# Patient Record
Sex: Female | Born: 1990
Health system: Southern US, Community
[De-identification: ages and names within clinical notes are randomized; demographics above are authoritative.]

## PROBLEM LIST (undated history)

## (undated) HISTORY — PX: TUBAL LIGATION: SHX77

---

## 2011-10-02 ENCOUNTER — Encounter (HOSPITAL_BASED_OUTPATIENT_CLINIC_OR_DEPARTMENT_OTHER): Payer: Self-pay | Admitting: *Deleted

## 2011-10-02 DIAGNOSIS — F17200 Nicotine dependence, unspecified, uncomplicated: Secondary | ICD-10-CM | POA: Insufficient documentation

## 2011-10-02 DIAGNOSIS — B9689 Other specified bacterial agents as the cause of diseases classified elsewhere: Secondary | ICD-10-CM | POA: Insufficient documentation

## 2011-10-02 DIAGNOSIS — N72 Inflammatory disease of cervix uteri: Secondary | ICD-10-CM | POA: Insufficient documentation

## 2011-10-02 DIAGNOSIS — N760 Acute vaginitis: Secondary | ICD-10-CM | POA: Insufficient documentation

## 2011-10-02 DIAGNOSIS — A499 Bacterial infection, unspecified: Secondary | ICD-10-CM | POA: Insufficient documentation

## 2011-10-02 DIAGNOSIS — M549 Dorsalgia, unspecified: Secondary | ICD-10-CM | POA: Insufficient documentation

## 2011-10-02 LAB — URINALYSIS, ROUTINE W REFLEX MICROSCOPIC
Bilirubin Urine: NEGATIVE
Glucose, UA: NEGATIVE mg/dL
Hgb urine dipstick: NEGATIVE
Ketones, ur: NEGATIVE mg/dL
Leukocytes, UA: NEGATIVE
Protein, ur: NEGATIVE mg/dL
pH: 5.5 (ref 5.0–8.0)

## 2011-10-02 NOTE — ED Notes (Signed)
Lower back pain. Late menses.

## 2011-10-03 ENCOUNTER — Encounter (HOSPITAL_BASED_OUTPATIENT_CLINIC_OR_DEPARTMENT_OTHER): Payer: Self-pay | Admitting: *Deleted

## 2011-10-03 ENCOUNTER — Emergency Department (HOSPITAL_BASED_OUTPATIENT_CLINIC_OR_DEPARTMENT_OTHER)
Admission: EM | Admit: 2011-10-03 | Discharge: 2011-10-03 | Disposition: A | Discharge status: Home / Self Care | Payer: Commercial Managed Care - PPO | Attending: Emergency Medicine | Admitting: Emergency Medicine

## 2011-10-03 DIAGNOSIS — N760 Acute vaginitis: Secondary | ICD-10-CM

## 2011-10-03 DIAGNOSIS — N72 Inflammatory disease of cervix uteri: Secondary | ICD-10-CM

## 2011-10-03 DIAGNOSIS — M549 Dorsalgia, unspecified: Secondary | ICD-10-CM

## 2011-10-03 DIAGNOSIS — B9689 Other specified bacterial agents as the cause of diseases classified elsewhere: Secondary | ICD-10-CM

## 2011-10-03 LAB — WET PREP, GENITAL
Trich, Wet Prep: NONE SEEN
Yeast Wet Prep HPF POC: NONE SEEN

## 2011-10-03 LAB — GC/CHLAMYDIA PROBE AMP, GENITAL: Chlamydia, DNA Probe: NEGATIVE

## 2011-10-03 MED ORDER — AZITHROMYCIN 250 MG PO TABS
1000.0000 mg | ORAL_TABLET | Freq: Once | ORAL | Status: AC
  Administered 2011-10-03: 1000 mg via ORAL
  Filled 2011-10-03: qty 4

## 2011-10-03 MED ORDER — CEFTRIAXONE SODIUM 250 MG IJ SOLR
250.0000 mg | Freq: Once | INTRAMUSCULAR | Status: AC
  Administered 2011-10-03: 250 mg via INTRAMUSCULAR
  Filled 2011-10-03: qty 250

## 2011-10-03 MED ORDER — LIDOCAINE HCL (PF) 1 % IJ SOLN
INTRAMUSCULAR | Status: AC
  Administered 2011-10-03: 1 mL
  Filled 2011-10-03: qty 5

## 2011-10-03 MED ORDER — IBUPROFEN 800 MG PO TABS
800.0000 mg | ORAL_TABLET | Freq: Once | ORAL | Status: AC
  Administered 2011-10-03: 800 mg via ORAL
  Filled 2011-10-03: qty 1

## 2011-10-03 MED ORDER — NAPROXEN 500 MG PO TABS: 500.0000 mg | ORAL_TABLET | Freq: Two times a day (BID) | ORAL | Status: AC

## 2011-10-03 MED ORDER — DOXYCYCLINE HYCLATE 100 MG PO CAPS: 100.0000 mg | ORAL_CAPSULE | Freq: Two times a day (BID) | ORAL | Status: AC

## 2011-10-03 MED ORDER — METRONIDAZOLE 500 MG PO TABS: 500.0000 mg | ORAL_TABLET | Freq: Two times a day (BID) | ORAL | Status: AC

## 2011-10-03 NOTE — Discharge Instructions (Signed)
Back Pain, Adult  Low back pain is very common. About 1 in 5 people have back pain. The cause of low back pain is rarely dangerous. The pain often gets better over time. About half of people with a sudden onset of back pain feel better in just 2 weeks. About 8 in 10 people feel better by 6 weeks.    CAUSES  Some common causes of back pain include:   Strain of the muscles or ligaments supporting the spine.    Wear and tear (degeneration) of the spinal discs.    Arthritis.    Direct injury to the back.   DIAGNOSIS  Most of the time, the direct cause of low back pain is not known. However, back pain can be treated effectively even when the exact cause of the pain is unknown. Answering your caregiver's questions about your overall health and symptoms is one of the most accurate ways to make sure the cause of your pain is not dangerous. If your caregiver needs more information, he or she may order lab work or imaging tests (X-rays or MRIs). However, even if imaging tests show changes in your back, this usually does not require surgery.  HOME CARE INSTRUCTIONS  For many people, back pain returns. Since low back pain is rarely dangerous, it is often a condition that people can learn to manage on their own.     Remain active. It is stressful on the back to sit or stand in one place. Do not sit, drive, or stand in one place for more than 30 minutes at a time. Take short walks on level surfaces as soon as pain allows. Try to increase the length of time you walk each day.    Do not stay in bed. Resting more than 1 or 2 days can delay your recovery.    Do not avoid exercise or work. Your body is made to move. It is not dangerous to be active, even though your back may hurt. Your back will likely heal faster if you return to being active before your pain is gone.     Pay attention to your body when you  bend and lift. Many people have less discomfort when lifting if they bend their knees, keep the load close to their bodies, and avoid twisting. Often, the most comfortable positions are those that put less stress on your recovering back.    Find a comfortable position to sleep. Use a firm mattress and lie on your side with your knees slightly bent. If you lie on your back, put a pillow under your knees.    Only take over-the-counter or prescription medicines as directed by your caregiver. Over-the-counter medicines to reduce pain and inflammation are often the most helpful. Your caregiver may prescribe muscle relaxant drugs. These medicines help dull your pain so you can more quickly return to your normal activities and healthy exercise.    Put ice on the injured area.    Put ice in a plastic bag.    Place a towel between your skin and the bag.    Leave the ice on for 15 to 20 minutes, 3 to 4 times a day for the first 2 to 3 days. After that, ice and heat may be alternated to reduce pain and spasms.    Ask your caregiver about trying back exercises and gentle massage. This may be of some benefit.    Avoid feeling anxious or stressed. Stress increases muscle tension and can worsen   back pain. It is important to recognize when you are anxious or stressed and learn ways to manage it. Exercise is a great option.   SEEK MEDICAL CARE IF:   You have pain that is not relieved with rest or medicine.    You have pain that does not improve in 1 week.    You have new symptoms.    You are generally not feeling well.   SEEK IMMEDIATE MEDICAL CARE IF:     You have pain that radiates from your back into your legs.    You develop new bowel or bladder control problems.    You have unusual weakness or numbness in your arms or legs.    You develop nausea or vomiting.    You develop abdominal pain.    You feel faint.    Document Released: 06/26/2005 Document Revised: 06/15/2011 Document Reviewed: 11/14/2010  [employer] Patient Information 2012 [employer]    Back Pain, Adult  Low back pain is very common. About 1 in 5 people have back pain. The cause of low back pain is rarely dangerous. The pain often gets better over time. About half of people with a sudden onset of back pain feel better in just 2 weeks. About 8 in 10 people feel better by 6 weeks.    CAUSES  Some common causes of back pain include:   Strain of the muscles or ligaments supporting the spine.    Wear and tear (degeneration) of the spinal discs.    Arthritis.    Direct injury to the back.   DIAGNOSIS  Most of the time, the direct cause of low back pain is not known. However, back pain can be treated effectively even when the exact cause of the pain is unknown. Answering your caregiver's questions about your overall health and symptoms is one of the most accurate ways to make sure the cause of your pain is not dangerous. If your caregiver needs more information, he or she may order lab work or imaging tests (X-rays or MRIs). However, even if imaging tests show changes in your back, this usually does not require surgery.  HOME CARE INSTRUCTIONS  For many people, back pain returns. Since low back pain is rarely dangerous, it is often a condition that people can learn to manage on their own.     Remain active. It is stressful on the back to sit or stand in one place. Do not sit, drive, or stand in one place for more than 30 minutes at a time. Take short walks on level surfaces as soon as pain allows. Try to increase the length of time you walk each day.    Do not stay in bed. Resting more than 1 or 2 days can delay your recovery.    Do not avoid exercise or work. Your body is made to move. It is not dangerous to be active, even though your back may hurt. Your back will likely heal faster if you return to being active before your pain is gone.     Pay attention to your body when you  bend and lift. Many people have less discomfort when lifting if they bend their knees, keep the load close to their bodies, and avoid twisting. Often, the most comfortable positions are those that put less stress on your recovering back.    Find a comfortable position to sleep. Use a firm mattress and lie on your side with your knees slightly bent. If you lie on   your back, put a pillow under your knees.    Only take over-the-counter or prescription medicines as directed by your caregiver. Over-the-counter medicines to reduce pain and inflammation are often the most helpful. Your caregiver may prescribe muscle relaxant drugs. These medicines help dull your pain so you can more quickly return to your normal activities and healthy exercise.    Put ice on the injured area.    Put ice in a plastic bag.    Place a towel between your skin and the bag.    Leave the ice on for 15 to 20 minutes, 3 to 4 times a day for the first 2 to 3 days. After that, ice and heat may be alternated to reduce pain and spasms.    Ask your caregiver about trying back exercises and gentle massage. This may be of some benefit.    Avoid feeling anxious or stressed. Stress increases muscle tension and can worsen back pain. It is important to recognize when you are anxious or stressed and learn ways to manage it. Exercise is a great option.   SEEK MEDICAL CARE IF:   You have pain that is not relieved with rest or medicine.    You have pain that does not improve in 1 week.    You have new symptoms.    You are generally not feeling well.   SEEK IMMEDIATE MEDICAL CARE IF:     You have pain that radiates from your back into your legs.    You develop new bowel or bladder control problems.    You have unusual weakness or numbness in your arms or legs.    You develop nausea or vomiting.    You develop abdominal pain.    You feel faint.    Document Released: 06/26/2005 Document Revised: 06/15/2011 Document Reviewed: 11/14/2010  [employer] Patient Information 2012 [employer].

## 2011-10-03 NOTE — ED Provider Notes (Signed)
History  This chart was scribed for Lindsay Roller, MD by Bennett Scrape. This patient was seen in room MH08/MH08 and the patient's care was started at 12:23AM.  CSN: [id number]  Arrival date & time 10/02/11  2129   First MD Initiated Contact with Patient 10/03/11 0022      Chief Complaint  Patient presents with  . Back Pain    The history is provided by the patient. No language interpreter was used.   Back pain started yesterday, was initially left lower back but today has spread to her right lower back as well. This is intermittent, sharp and stabbing, does not radiate and is not associated with fevers chills nausea or vomiting.  5 days late on period, home pregnancy test was negative, lower back pain, no fevers, nausea or vomiting, had ovarian pains about a week ago, vaginal discharge really bad one day last week, no h/o STDs, sexually active with a new partner, does not use protection every time, not on birth control.  History reviewed. No pertinent past medical history.  Past Surgical History  Procedure Date  . Cesarean section     No family history on file.  History  Substance Use Topics  . Smoking status: Current Everyday Smoker -- 0.5 packs/day  . Smokeless tobacco: Never Used  . Alcohol Use: 0.6 oz/week    1 Cans of beer per week    Review of Systems  Constitutional: Negative for fever and chills.  HENT: Negative for congestion, sore throat and ear discharge.   Respiratory: Negative for cough and shortness of breath.   Gastrointestinal: Negative for nausea, vomiting, abdominal pain and diarrhea.  Genitourinary: Positive for vaginal discharge. Negative for dysuria and hematuria.  Musculoskeletal: Positive for back pain.  Skin: Negative for rash.    Allergies  Review of patient's allergies indicates no known allergies.  Home Medications   Current Outpatient Rx  Name Route Sig Dispense Refill  . DOXYCYCLINE HYCLATE 100 MG PO CAPS Oral Take 1 capsule (100  mg total) by mouth 2 (two) times daily. 20 capsule 0  . METRONIDAZOLE 500 MG PO TABS Oral Take 1 tablet (500 mg total) by mouth 2 (two) times daily. 14 tablet 0  . NAPROXEN 500 MG PO TABS Oral Take 1 tablet (500 mg total) by mouth 2 (two) times daily with a meal. 30 tablet 0    Triage Vitals: BP 119/65  Pulse 84  Temp(Src) 98.1 F (36.7 C) (Oral)  Resp 18  SpO2 100%  LMP 08/30/2011  Physical Exam  Nursing note and vitals reviewed. Constitutional: She is oriented to person, place, and time. She appears well-developed and well-nourished.       Well appearing with normal affect and gait  HENT:  Head: Normocephalic and atraumatic.  Eyes: Conjunctivae and EOM are normal. Pupils are equal, round, and reactive to light.  Neck: Normal range of motion. Neck supple.  Cardiovascular: Normal rate, regular rhythm and normal heart sounds.   Pulmonary/Chest: Effort normal and breath sounds normal. No respiratory distress.  Abdominal: Soft. There is no tenderness.  Genitourinary:       Upon pelvic exam, pt had mild cervical motion tenderness and increased vaginal discharge  Musculoskeletal: Normal range of motion. She exhibits no edema.       No tenderness to palpation over the right or left lower back, patient is able to have full range of motion to rotation and flexion and extension without pain  Neurological: She is alert and oriented to  person, place, and time. No cranial nerve deficit.  Skin: Skin is warm and dry. No rash noted.    ED Course  Procedures (including critical care time)  DIAGNOSTIC STUDIES: Oxygen Saturation is 100% on room air, normal by my interpretation.    COORDINATION OF CARE: 12:25AM-Discussed STD tests and negative pregnancy with pt. Will give pt tylenol for back pain.   Labs Reviewed  WET PREP, GENITAL - Abnormal; Notable for the following:    Clue Cells Wet Prep HPF POC MANY (*)    WBC, Wet Prep HPF POC FEW (*)    All other components within normal limits    URINALYSIS, ROUTINE W REFLEX MICROSCOPIC  PREGNANCY, URINE  GC/CHLAMYDIA PROBE AMP, GENITAL   No results found.   1. Back pain   2. Bacterial vaginosis   3. Cervicitis       MDM  Laboratory results reviewed and show that the patient had many clue cells on the wet prep consistent with bacterial vaginosis, urinary test negative for pregnancy or urinary infection. Due to the cervical motion tenderness and vaginal discharge from the cervical os, patient was given ceftriaxone, Zithromax and Advil. She was also given prescriptions.  Back pain is benign, patient has no tenderness on exam  Patient informed of indications for return and that she would receive a phone call should she have a positive test for STDs. Education given on informing sexual partners should these tests because.  Disposition: Home  Discharge Prescriptions include:  #1 doxycycline #2 Flagyl #3 Naprosyn   I personally performed the services described in this documentation, which was scribed in my presence. The recorded information has been reviewed and considered.         Lindsay Roller, MD 10/03/11 [phone]

## 2012-11-12 ENCOUNTER — Encounter (HOSPITAL_BASED_OUTPATIENT_CLINIC_OR_DEPARTMENT_OTHER): Payer: Self-pay | Admitting: *Deleted

## 2012-11-12 ENCOUNTER — Emergency Department (HOSPITAL_BASED_OUTPATIENT_CLINIC_OR_DEPARTMENT_OTHER)
Admission: EM | Admit: 2012-11-12 | Discharge: 2012-11-12 | Disposition: A | Discharge status: Home / Self Care | Payer: Commercial Managed Care - PPO | Attending: Emergency Medicine | Admitting: Emergency Medicine

## 2012-11-12 DIAGNOSIS — F17200 Nicotine dependence, unspecified, uncomplicated: Secondary | ICD-10-CM | POA: Insufficient documentation

## 2012-11-12 DIAGNOSIS — Z79899 Other long term (current) drug therapy: Secondary | ICD-10-CM | POA: Insufficient documentation

## 2012-11-12 DIAGNOSIS — B86 Scabies: Secondary | ICD-10-CM | POA: Insufficient documentation

## 2012-11-12 MED ORDER — PERMETHRIN 5 % EX CREA: TOPICAL_CREAM | CUTANEOUS | Status: DC

## 2012-11-12 NOTE — ED Provider Notes (Signed)
History     CSN: [id number]  Arrival date & time 11/12/12  2038   First MD Initiated Contact with Patient 11/12/12 2121      Chief Complaint  Patient presents with  . Rash    (Consider location/radiation/quality/duration/timing/severity/associated sxs/prior treatment) Patient is a 22 y.o. female presenting with rash. The history is provided by the patient. No language interpreter was used.  Rash Location:  Full body Quality: itchiness and redness   Severity:  Moderate Duration:  5 days Timing:  Constant Progression:  Worsening Chronicity:  New Relieved by:  Nothing Worsened by:  Nothing tried Ineffective treatments:  None tried   History reviewed. No pertinent past medical history.  Past Surgical History  Procedure Laterality Date  . Cesarean section      No family history on file.  History  Substance Use Topics  . Smoking status: Current Every Day Smoker -- 0.50 packs/day  . Smokeless tobacco: Never Used  . Alcohol Use: 0.6 oz/week    1 Cans of beer per week    OB History   Grav Para Term Preterm Abortions TAB SAB Ect Mult Living                  Review of Systems  Skin: Positive for rash.  All other systems reviewed and are negative.    Allergies  Review of patient's allergies indicates no known allergies.  Home Medications   Current Outpatient Rx  Name  Route  Sig  Dispense  Refill  . permethrin (ELIMITE) 5 % cream      Apply to affected area once   60 g   1     BP 115/62  Pulse 63  Temp(Src) 99.2 F (37.3 C) (Oral)  Resp 16  Ht 5\' 4"  (1.626 m)  Wt 165 lb (74.844 kg)  BMI 28.31 kg/m2  SpO2 100%  Physical Exam  Nursing note and vitals reviewed. Constitutional: She is oriented to person, place, and time. She appears well-developed.  Musculoskeletal:  Rash between fingers  Neurological: She is alert and oriented to person, place, and time.  Skin: Rash noted.  Psychiatric: She has a normal mood and affect.    ED Course   Procedures (including critical care time)  Labs Reviewed - No data to display No results found.   1. Scabies       MDM  elemite       Elson Areas, PA-C 11/12/12 2142

## 2012-11-12 NOTE — ED Notes (Signed)
Rash x 5 days. Worse on her hands but is starting on her chest.

## 2012-11-13 NOTE — ED Provider Notes (Signed)
Medical screening examination/treatment/procedure(s) were performed by non-physician practitioner and as supervising physician I was immediately available for consultation/collaboration.  Novah Goza M Manvir Prabhu, MD 11/13/12 2121 

## 2014-06-08 ENCOUNTER — Emergency Department (HOSPITAL_BASED_OUTPATIENT_CLINIC_OR_DEPARTMENT_OTHER)
Admission: EM | Admit: 2014-06-08 | Discharge: 2014-06-08 | Disposition: A | Discharge status: Home / Self Care | Payer: Medicaid Other | Attending: Emergency Medicine | Admitting: Emergency Medicine

## 2014-06-08 ENCOUNTER — Encounter (HOSPITAL_BASED_OUTPATIENT_CLINIC_OR_DEPARTMENT_OTHER): Payer: Self-pay | Admitting: *Deleted

## 2014-06-08 DIAGNOSIS — F17210 Nicotine dependence, cigarettes, uncomplicated: Secondary | ICD-10-CM | POA: Insufficient documentation

## 2014-06-08 DIAGNOSIS — Z3A01 Less than 8 weeks gestation of pregnancy: Secondary | ICD-10-CM | POA: Insufficient documentation

## 2014-06-08 DIAGNOSIS — J069 Acute upper respiratory infection, unspecified: Secondary | ICD-10-CM | POA: Diagnosis not present

## 2014-06-08 DIAGNOSIS — R109 Unspecified abdominal pain: Secondary | ICD-10-CM | POA: Insufficient documentation

## 2014-06-08 DIAGNOSIS — Z79899 Other long term (current) drug therapy: Secondary | ICD-10-CM | POA: Diagnosis not present

## 2014-06-08 DIAGNOSIS — O99331 Smoking (tobacco) complicating pregnancy, first trimester: Secondary | ICD-10-CM | POA: Diagnosis not present

## 2014-06-08 DIAGNOSIS — O99511 Diseases of the respiratory system complicating pregnancy, first trimester: Secondary | ICD-10-CM | POA: Diagnosis not present

## 2014-06-08 DIAGNOSIS — O9989 Other specified diseases and conditions complicating pregnancy, childbirth and the puerperium: Secondary | ICD-10-CM | POA: Diagnosis present

## 2014-06-08 DIAGNOSIS — Z3490 Encounter for supervision of normal pregnancy, unspecified, unspecified trimester: Secondary | ICD-10-CM

## 2014-06-08 LAB — RAPID STREP SCREEN (MED CTR MEBANE ONLY): Streptococcus, Group A Screen (Direct): NEGATIVE

## 2014-06-08 MED ORDER — PRENATAL VITAMIN 27-0.8 MG PO TABS: 1.0000 | ORAL_TABLET | Freq: Every day | ORAL | Status: DC

## 2014-06-08 NOTE — ED Notes (Signed)
Reports "feeling weak" since yesterday- cough, runny nose, sore throat. + preg test 11/14

## 2014-06-08 NOTE — ED Notes (Signed)
Directed to pharmacy to pick up meds 

## 2014-06-08 NOTE — Discharge Instructions (Signed)
Return to the ED with any concerns including difficulty breathing, vomiting and not able to keep down liquids, fainting, vaginal bleeding, abdominal pain, decreased level of alertness/lethargy, or any other alarming symptoms

## 2014-06-08 NOTE — ED Provider Notes (Signed)
CSN: [id number]     Arrival date & time 06/08/14  [id number] History   First MD Initiated Contact with Patient 06/08/14 [phone]     Chief Complaint  Patient presents with  . Fatigue     (Consider location/radiation/quality/duration/timing/severity/associated sxs/prior Treatment) HPI  Pt presenting with c/o runny nose, sore throat and feeling fatigued with mild cough.  Symptoms started yesterday. She took positive pregnancy test on 11/14 at home. No vaginal bleeding.  No significant abdominal pain.  She states she has felt very mild cramping in lower abdomen intermittently for the past several weeks, but no significant pain.  No pain currently.  No fever/chills.  Cough is nonproductive. She has not had any treatment for her symptoms prior to arrival.  There are no other associated systemic symptoms, there are no other alleviating or modifying factors.   History reviewed. No pertinent past medical history. Past Surgical History  Procedure Laterality Date  . Cesarean section     No family history on file. History  Substance Use Topics  . Smoking status: Current Every Day Smoker -- 0.50 packs/day    Types: Cigarettes  . Smokeless tobacco: Never Used  . Alcohol Use: No     Comment: not currently   OB History    Gravida Para Term Preterm AB TAB SAB Ectopic Multiple Living   1              Review of Systems  ROS reviewed and all otherwise negative except for mentioned in HPI    Allergies  Review of patient's allergies indicates no known allergies.  Home Medications   Prior to Admission medications   Medication Sig Start Date End Date Taking? Authorizing Provider  permethrin (ELIMITE) 5 % cream Apply to affected area once 11/12/12   Elson AreasLeslie K Sofia, PA-C  Prenatal Vit-Fe Fumarate-FA (PRENATAL VITAMIN) 27-0.8 MG TABS Take 1 tablet by mouth daily. 06/08/14   Ethelda ChickMartha K Linker, MD   BP 121/67 mmHg  Pulse 78  Temp(Src) 98.7 F (37.1 C) (Oral)  Resp 16  Ht 5\' 4"  (1.626 m)  Wt 200 lb  (90.719 kg)  BMI 34.31 kg/m2  SpO2 100%  LMP 04/24/2014  Vitals reviewed Physical Exam  Physical Examination: General appearance - alert, well appearing, and in no distress Mental status - alert, oriented to person, place, and time Eyes - no conjunctival injection, no scleral icterus Mouth - mucous membranes moist, pharynx normal without lesions, mild erythema of posterior OP, palate symmetric, uvula midline Neck - supple, no significant adenopathy Chest - clear to auscultation, no wheezes, rales or rhonchi, symmetric air entry Heart - normal rate, regular rhythm, normal S1, S2, no murmurs, rubs, clicks or gallops Abdomen - soft, nontender, nondistended, no masses or organomegaly Extremities - peripheral pulses normal, no pedal edema, no clubbing or cyanosis Skin - normal coloration and turgor, no rashes  ED Course  Procedures (including critical care time) Labs Review Labs Reviewed  RAPID STREP SCREEN  CULTURE, GROUP A STREP    Imaging Review No results found.   EKG Interpretation None      MDM   Final diagnoses:  Viral URI  Pregnancy    Pt presenting with symptoms of viral URI, strep test negative, lungs CTA, normal respiratory effort, no tachypneia or hypoxia to suggest pneumonia- will avoid CXR in pregnancy.  Pt has no abdominal tenderness and no vaginal bleeding.  Given information for followup.  Recommended tylenol and nasal spray for symptoms and to avoid other meds during pregnancy  until consulting with OB.  Given rx for prenatal vitamins.  Discharged with strict return precautions.  Pt agreeable with plan.    Gricel Copen K LinEthelda Chickker, MD 06/08/14 [phone]

## 2014-06-10 LAB — CULTURE, GROUP A STREP

## 2017-05-16 ENCOUNTER — Encounter (HOSPITAL_BASED_OUTPATIENT_CLINIC_OR_DEPARTMENT_OTHER): Payer: Self-pay | Admitting: *Deleted

## 2017-05-16 ENCOUNTER — Emergency Department (HOSPITAL_BASED_OUTPATIENT_CLINIC_OR_DEPARTMENT_OTHER)
Admission: EM | Admit: 2017-05-16 | Discharge: 2017-05-16 | Disposition: A | Discharge status: Home / Self Care | Payer: Commercial Managed Care - PPO | Attending: Emergency Medicine | Admitting: Emergency Medicine

## 2017-05-16 ENCOUNTER — Other Ambulatory Visit: Payer: Self-pay

## 2017-05-16 ENCOUNTER — Emergency Department (HOSPITAL_BASED_OUTPATIENT_CLINIC_OR_DEPARTMENT_OTHER): Payer: Commercial Managed Care - PPO

## 2017-05-16 DIAGNOSIS — K429 Umbilical hernia without obstruction or gangrene: Secondary | ICD-10-CM | POA: Insufficient documentation

## 2017-05-16 DIAGNOSIS — F17210 Nicotine dependence, cigarettes, uncomplicated: Secondary | ICD-10-CM | POA: Insufficient documentation

## 2017-05-16 DIAGNOSIS — R0789 Other chest pain: Secondary | ICD-10-CM | POA: Insufficient documentation

## 2017-05-16 LAB — URINALYSIS, MICROSCOPIC (REFLEX)

## 2017-05-16 LAB — COMPREHENSIVE METABOLIC PANEL
ALT: 18 U/L (ref 14–54)
AST: 16 U/L (ref 15–41)
Albumin: 4 g/dL (ref 3.5–5.0)
Alkaline Phosphatase: 81 U/L (ref 38–126)
Anion gap: 8 (ref 5–15)
BILIRUBIN TOTAL: 0.4 mg/dL (ref 0.3–1.2)
BUN: 12 mg/dL (ref 6–20)
CO2: 23 mmol/L (ref 22–32)
CREATININE: 0.69 mg/dL (ref 0.44–1.00)
Calcium: 9.1 mg/dL (ref 8.9–10.3)
Chloride: 105 mmol/L (ref 101–111)
GFR calc Af Amer: >60 mL/min (ref >60)
Glucose, Bld: 90 mg/dL (ref 65–99)
Potassium: 4 mmol/L (ref 3.5–5.1)
Sodium: 136 mmol/L (ref 135–145)
TOTAL PROTEIN: 8 g/dL (ref 6.5–8.1)

## 2017-05-16 LAB — CBC
HEMATOCRIT: 42.6 % (ref 36.0–46.0)
HEMOGLOBIN: 14.1 g/dL (ref 12.0–15.0)
MCH: 31.3 pg (ref 26.0–34.0)
MCHC: 33.1 g/dL (ref 30.0–36.0)
MCV: 94.5 fL (ref 78.0–100.0)
Platelets: 381 10*3/uL (ref 150–400)
RBC: 4.51 MIL/uL (ref 3.87–5.11)
RDW: 12.8 % (ref 11.5–15.5)
WBC: 17.2 10*3/uL — AB (ref 4.0–10.5)

## 2017-05-16 LAB — URINALYSIS, ROUTINE W REFLEX MICROSCOPIC
GLUCOSE, UA: NEGATIVE mg/dL
KETONES UR: 15 mg/dL — AB
Nitrite: NEGATIVE
Protein, ur: NEGATIVE mg/dL
Specific Gravity, Urine: >1.03 — ABNORMAL HIGH (ref 1.005–1.030)
pH: 5.5 (ref 5.0–8.0)

## 2017-05-16 LAB — PREGNANCY, URINE: Preg Test, Ur: NEGATIVE

## 2017-05-16 LAB — TROPONIN I

## 2017-05-16 NOTE — ED Notes (Signed)
ED Provider at bedside. 

## 2017-05-16 NOTE — ED Provider Notes (Signed)
[facility] EMERGENCY DEPARTMENT Provider Note   CSN: [id number] Arrival date & time: 05/16/17  1048     History   Chief Complaint Chief Complaint  Patient presents with  . Chest Pain    HPI Lindsay Bernard is a 26 y.o. female.  26 yo F with a chief complaint of chest pain.  She describes this as a pressure as all of her chest.  Has been going on for the past month and a half or so.  She thinks it might be slightly worse with exertion.  She does have some shortness of breath on exertion.  Denies lower extremity edema denies hemoptysis denies prior PE or DVT denies estrogen use.  Denies recent surgery or hospitalization.  Denies prolonged travel.  The patient is a smoker.  She denies hypertension, hyperlipidemia, diabetes, or family history of MI.  This pain starts in the left side of her chest and then transiently moved to the left lateral aspect of the chest wall and then up to the trapezius area.  Her symptoms have mildly improved since they started earlier today.   The history is provided by the patient.  Chest Pain   This is a new problem. The current episode started more than 1 week ago. The problem occurs constantly. The problem has been gradually worsening. The pain is associated with exertion and movement. The pain is present in the lateral region. The pain is at a severity of 4/10. The pain is moderate. The quality of the pain is described as brief, exertional, heavy and sharp. The pain radiates to the left neck. Duration of episode(s) is 1 month. Associated symptoms include shortness of breath. Pertinent negatives include no dizziness, no fever, no headaches, no nausea, no palpitations and no vomiting. She has tried nothing for the symptoms. The treatment provided no relief. Risk factors include smoking/tobacco exposure.  Pertinent negatives for past medical history include no diabetes, no hyperlipidemia, no hypertension and no MI.  Pertinent negatives for family medical  history include: no early MI.    History reviewed. No pertinent past medical history.  There are no active problems to display for this patient.   Past Surgical History:  Procedure Laterality Date  . CESAREAN SECTION    . CESAREAN SECTION    . TUBAL LIGATION      OB History    Gravida Para Term Preterm AB Living   1             SAB TAB Ectopic Multiple Live Births                   Home Medications    Prior to Admission medications   Medication Sig Start Date End Date Taking? Authorizing Provider  permethrin (ELIMITE) 5 % cream Apply to affected area once 11/12/12   Elson AreasSofia, Leslie K, PA-C  Prenatal Vit-Fe Fumarate-FA (PRENATAL VITAMIN) 27-0.8 MG TABS Take 1 tablet by mouth daily. 06/08/14   MabeLatanya Maudlin, Martha L, MD    Family History No family history on file.  Social History Social History   Tobacco Use  . Smoking status: Current Every Day Smoker    Packs/day: 0.50    Types: Cigarettes  . Smokeless tobacco: Never Used  Substance Use Topics  . Alcohol use: No    Comment: not currently  . Drug use: No     Allergies   Patient has no known allergies.   Review of Systems Review of Systems  Constitutional: Negative for chills and fever.  HENT: Negative for congestion and rhinorrhea.   Eyes: Negative for redness and visual disturbance.  Respiratory: Positive for shortness of breath. Negative for wheezing.   Cardiovascular: Positive for chest pain. Negative for palpitations.  Gastrointestinal: Negative for nausea and vomiting.  Genitourinary: Negative for dysuria and urgency.  Musculoskeletal: Negative for arthralgias and myalgias.  Skin: Negative for pallor and wound.  Neurological: Negative for dizziness and headaches.     Physical Exam Updated Vital Signs BP 126/85 (BP Location: Right Wrist)   Pulse 69   Temp 98.4 F (36.9 C) (Oral)   Resp 16   Ht 5\' 4"  (1.626 m)   Wt 112.9 kg (249 lb)   LMP 04/13/2017   SpO2 100%   Breastfeeding? No   BMI 42.74  kg/m   Physical Exam  Constitutional: She is oriented to person, place, and time. She appears well-developed and well-nourished. No distress.  HENT:  Head: Normocephalic and atraumatic.  Eyes: EOM are normal. Pupils are equal, round, and reactive to light.  Neck: Normal range of motion. Neck supple.  Cardiovascular: Normal rate and regular rhythm. Exam reveals no gallop and no friction rub.  No murmur heard. Left-sided chest wall tenderness that reproduces her pain.  Pulmonary/Chest: Effort normal. She has no wheezes. She has no rales.  Abdominal: Soft. She exhibits no distension. There is no tenderness.  Musculoskeletal: She exhibits no edema or tenderness.  Neurological: She is alert and oriented to person, place, and time.  Skin: Skin is warm and dry. She is not diaphoretic.  Psychiatric: She has a normal mood and affect. Her behavior is normal.  Nursing note and vitals reviewed.    ED Treatments / Results  Labs (all labs ordered are listed, but only abnormal results are displayed) Labs Reviewed  CBC - Abnormal; Notable for the following components:      Result Value   WBC 17.2 (*)    All other components within normal limits  URINALYSIS, ROUTINE W REFLEX MICROSCOPIC - Abnormal; Notable for the following components:   APPearance CLOUDY (*)    Specific Gravity, Urine >1.030 (*)    Hgb urine dipstick MODERATE (*)    Bilirubin Urine SMALL (*)    Ketones, ur 15 (*)    Leukocytes, UA SMALL (*)    All other components within normal limits  URINALYSIS, MICROSCOPIC (REFLEX) - Abnormal; Notable for the following components:   Bacteria, UA MANY (*)    Squamous Epithelial / LPF 6-30 (*)    All other components within normal limits  COMPREHENSIVE METABOLIC PANEL  TROPONIN I  PREGNANCY, URINE    EKG  EKG Interpretation  Date/Time:  Wednesday May 16 2017 11:07:10 EST Ventricular Rate:  72 PR Interval:  146 QRS Duration: 82 QT Interval:  384 QTC Calculation: 420 R  Axis:   52 Text Interpretation:  Normal sinus rhythm Cannot rule out Anterior infarct , age undetermined Abnormal ECG No old tracing to compare Confirmed by Melene PlanFloyd, Ad Guttman [phone]([id number]) on 05/16/2017 3:05:35 PM       Radiology Dg Chest 2 View  Result Date: 05/16/2017 CLINICAL DATA:  Intermittent chest pains and chest tightness with shortness of breath for several months but increased severity yesterday. Current smoker. EXAM: CHEST  2 VIEW COMPARISON:  None in PACs FINDINGS: The lungs are adequately inflated and clear. The heart and pulmonary vascularity are normal. The mediastinum is normal in width. There is no pleural effusion. The bony thorax exhibits no acute abnormality. IMPRESSION: There is no active cardiopulmonary disease.  Electronically Signed   By: David  [place] M.D.   On: 05/16/2017 13:09    Procedures Procedures (including critical care time)  Medications Ordered in ED Medications - No data to display   Initial Impression / Assessment and Plan / ED Course  I have reviewed the triage vital signs and the nursing notes.  Pertinent labs & imaging results that were available during my care of the patient were reviewed by me and considered in my medical decision making (see chart for details).     26 yo F with a chief complaint of chest pain.  This is atypical in nature.  It is reproduced with palpation of the chest wall.  She is PERC negative.  She had a workup done in triage EKG chest x-ray troponin all negative.  The patient is also had a umbilical hernia that has bothered her for the past couple years.  They are wondering what to do with it.  We will give her surgical follow-up.  Discharge home.  Discussed smoking cessation with patient and was they were offerred resources to help stop.  Total time was 5 min CPT code 40981.    3:33 PM:  I have discussed the diagnosis/risks/treatment options with the patient and family and believe the pt to be eligible for discharge home to follow-up  with PCP, Gen surgery. We also discussed returning to the ED immediately if new or worsening sx occur. We discussed the sx which are most concerning (e.g., sudden worsening pain, fever, inability to tolerate by mouth) that necessitate immediate return. Medications administered to the patient during their visit and any new prescriptions provided to the patient are listed below.  Medications given during this visit Medications - No data to display   The patient appears reasonably screen and/or stabilized for discharge and I doubt any other medical condition or other [facility] requiring further screening, evaluation, or treatment in the ED at this time prior to discharge.    Final Clinical Impressions(s) / ED Diagnoses   Final diagnoses:  Atypical chest pain  Umbilical hernia without obstruction and without gangrene    ED Discharge Orders    None       Melene Plan, DO 05/16/17 1533

## 2017-05-16 NOTE — ED Triage Notes (Signed)
Pt reports chest pain since last night across chest. States she "coughed real hard" then couldn't catch her breath. "I felt like I was having a panic attack". States chest "feels tight". Also reports pain in her left axilla

## 2017-05-16 NOTE — ED Notes (Signed)
Patient transported to X-ray 

## 2017-05-16 NOTE — Discharge Instructions (Signed)
Take 4 over the counter ibuprofen tablets 3 times a day or 2 over-the-counter naproxen tablets twice a day for pain. Also take tylenol 1000mg(2 extra strength) four times a day.    

## 2017-08-13 ENCOUNTER — Encounter (HOSPITAL_BASED_OUTPATIENT_CLINIC_OR_DEPARTMENT_OTHER): Payer: Self-pay

## 2017-08-13 ENCOUNTER — Other Ambulatory Visit: Payer: Self-pay

## 2017-08-13 DIAGNOSIS — K649 Unspecified hemorrhoids: Secondary | ICD-10-CM | POA: Insufficient documentation

## 2017-08-13 DIAGNOSIS — Z5321 Procedure and treatment not carried out due to patient leaving prior to being seen by health care provider: Secondary | ICD-10-CM | POA: Insufficient documentation

## 2017-08-13 NOTE — ED Triage Notes (Signed)
C/o hemorrhoid pain x today-NAD-slow gait

## 2017-08-14 ENCOUNTER — Other Ambulatory Visit: Payer: Self-pay

## 2017-08-14 ENCOUNTER — Encounter (HOSPITAL_BASED_OUTPATIENT_CLINIC_OR_DEPARTMENT_OTHER): Payer: Self-pay | Admitting: *Deleted

## 2017-08-14 ENCOUNTER — Emergency Department (HOSPITAL_BASED_OUTPATIENT_CLINIC_OR_DEPARTMENT_OTHER)
Admission: EM | Admit: 2017-08-14 | Discharge: 2017-08-14 | Discharge status: Against Medical Advice | Payer: Commercial Managed Care - PPO | Attending: Emergency Medicine | Admitting: Emergency Medicine

## 2017-08-14 ENCOUNTER — Emergency Department (HOSPITAL_BASED_OUTPATIENT_CLINIC_OR_DEPARTMENT_OTHER)
Admission: EM | Admit: 2017-08-14 | Discharge: 2017-08-14 | Disposition: A | Discharge status: Home / Self Care | Payer: Commercial Managed Care - PPO | Attending: Emergency Medicine | Admitting: Emergency Medicine

## 2017-08-14 DIAGNOSIS — K645 Perianal venous thrombosis: Secondary | ICD-10-CM | POA: Insufficient documentation

## 2017-08-14 DIAGNOSIS — F17210 Nicotine dependence, cigarettes, uncomplicated: Secondary | ICD-10-CM | POA: Insufficient documentation

## 2017-08-14 MED ORDER — LIDOCAINE-EPINEPHRINE 2 %-1:100000 IJ SOLN
20.0000 mL | Freq: Once | INTRAMUSCULAR | Status: DC
  Filled 2017-08-14: qty 1

## 2017-08-14 MED ORDER — LIDOCAINE HCL 2 % IJ SOLN
INTRAMUSCULAR | Status: AC
  Filled 2017-08-14: qty 20

## 2017-08-14 MED ORDER — LIDOCAINE HCL (PF) 2 % IJ SOLN
INTRAMUSCULAR | Status: AC
  Filled 2017-08-14: qty 2

## 2017-08-14 NOTE — ED Notes (Signed)
ED Provider at bedside. 

## 2017-08-14 NOTE — Discharge Instructions (Signed)
Call the General Surgery team for follow up if not improving

## 2017-08-14 NOTE — ED Triage Notes (Signed)
Pt has hemorrhoids. States increased pain and swelling after BM yesterday

## 2017-08-14 NOTE — ED Notes (Signed)
Called to treatment room with no answer from lobby 

## 2017-08-16 NOTE — ED Provider Notes (Signed)
[facility] EMERGENCY DEPARTMENT Provider Note   CSN: [id number] Arrival date & time: 08/14/17  1004     History   Chief Complaint Chief Complaint  Patient presents with  . Rectal Pain    HPI Lindsay Bernard is a 27 y.o. female.  HPI Patient is a 27 year old female presents to the emergency department with increasing hemorrhoidal pain.  No fevers or chills.  She has had hemorrhoids before but feels as though she has a large swollen hemorrhoid that is causing her significant discomfort.  It hurts to sit down.  Pain is moderate in severity.  No other complaints.   History reviewed. No pertinent past medical history.  There are no active problems to display for this patient.   Past Surgical History:  Procedure Laterality Date  . CESAREAN SECTION    . CESAREAN SECTION    . TUBAL LIGATION      OB History    Gravida Para Term Preterm AB Living   1             SAB TAB Ectopic Multiple Live Births                   Home Medications    Prior to Admission medications   Not on File    Family History No family history on file.  Social History Social History   Tobacco Use  . Smoking status: Current Every Day Smoker    Packs/day: 0.50    Types: Cigarettes  . Smokeless tobacco: Never Used  Substance Use Topics  . Alcohol use: No  . Drug use: No     Allergies   Patient has no known allergies.   Review of Systems Review of Systems  All other systems reviewed and are negative.    Physical Exam Updated Vital Signs BP 111/73 (BP Location: Left Arm)   Pulse 78   Temp 98 F (36.7 C) (Oral)   Resp 18   Ht 5\' 4"  (1.626 m)   Wt 114.8 kg (253 lb)   LMP 07/21/2017   SpO2 100%   BMI 43.43 kg/m   Physical Exam  Constitutional: She is oriented to person, place, and time. She appears well-developed and well-nourished.  HENT:  Head: Normocephalic.  Eyes: EOM are normal.  Neck: Normal range of motion.  Pulmonary/Chest: Effort normal.  Abdominal:  She exhibits no distension.  Genitourinary:  Genitourinary Comments: Chaperone present.  Large thrombosed external hemorrhoid.  No surrounding perianal erythema or signs of infection.  Musculoskeletal: Normal range of motion.  Neurological: She is alert and oriented to person, place, and time.  Psychiatric: She has a normal mood and affect.  Nursing note and vitals reviewed.    ED Treatments / Results  Labs (all labs ordered are listed, but only abnormal results are displayed) Labs Reviewed - No data to display  EKG  EKG Interpretation None       Radiology No results found.  Procedures .Marland KitchenIncision and Drainage Performed by: Azalia Bilis, MD Authorized by: Azalia Bilis, MD     EXCISION OF THROMBOSED EXTERNAL HEMMORRHOID Anesthesia: 5 cc 2% lidocaine with epi Procedure: After appropriate and standard sterilization of the surgical field and anesthesia applied a linear incision was made along the largest section of the external hemorrhoid.  Large clot was delivered with significant improvement in the patient's pain.  No significant bleeding post procedure.  Patient tolerated the procedure well.  No complications.    Medications Ordered in ED Medications - No  data to display   Initial Impression / Assessment and Plan / ED Course  I have reviewed the triage vital signs and the nursing notes.  Pertinent labs & imaging results that were available during my care of the patient were reviewed by me and considered in my medical decision making (see chart for details).     Patient tolerated the procedure well.  Significant improvement in her pain after excision of the thrombosed external hemorrhoid and delivery of the clot.  Discharged home with recommendation for sits baths and general surgery follow-up for possible formal hemorrhoidectomy.  Patient overall well-appearing.  Stable for discharge.  No bleeding.  Patient given infection warnings.  She understands to return to the  emergency department for new or worsening symptoms  Final Clinical Impressions(s) / ED Diagnoses   Final diagnoses:  Thrombosed external hemorrhoid    ED Discharge Orders    None       Azalia Bilisampos, Lelania Bia, MD 08/16/17 [phone]

## 2018-04-04 ENCOUNTER — Encounter (HOSPITAL_BASED_OUTPATIENT_CLINIC_OR_DEPARTMENT_OTHER): Payer: Self-pay | Admitting: Emergency Medicine

## 2018-04-04 ENCOUNTER — Other Ambulatory Visit: Payer: Self-pay

## 2018-04-04 ENCOUNTER — Emergency Department (HOSPITAL_BASED_OUTPATIENT_CLINIC_OR_DEPARTMENT_OTHER): Payer: Commercial Managed Care - PPO

## 2018-04-04 ENCOUNTER — Emergency Department (HOSPITAL_BASED_OUTPATIENT_CLINIC_OR_DEPARTMENT_OTHER)
Admission: EM | Admit: 2018-04-04 | Discharge: 2018-04-04 | Disposition: A | Discharge status: Home / Self Care | Payer: Commercial Managed Care - PPO | Attending: Emergency Medicine | Admitting: Emergency Medicine

## 2018-04-04 DIAGNOSIS — J069 Acute upper respiratory infection, unspecified: Secondary | ICD-10-CM

## 2018-04-04 DIAGNOSIS — B9789 Other viral agents as the cause of diseases classified elsewhere: Secondary | ICD-10-CM

## 2018-04-04 DIAGNOSIS — F17210 Nicotine dependence, cigarettes, uncomplicated: Secondary | ICD-10-CM | POA: Insufficient documentation

## 2018-04-04 MED ORDER — AMOXICILLIN 500 MG PO CAPS: 500.0000 mg | ORAL_CAPSULE | Freq: Three times a day (TID) | ORAL | 0 refills | Status: DC

## 2018-04-04 MED ORDER — BENZONATATE 100 MG PO CAPS: 100.0000 mg | ORAL_CAPSULE | Freq: Three times a day (TID) | ORAL | 0 refills | Status: DC

## 2018-04-04 NOTE — ED Notes (Signed)
Patient transported to X-ray 

## 2018-04-04 NOTE — ED Triage Notes (Signed)
Cough and congestion x 2 days 

## 2018-04-04 NOTE — Discharge Instructions (Signed)
Return if any problems.

## 2018-04-05 NOTE — ED Provider Notes (Signed)
[facility] EMERGENCY DEPARTMENT Provider Note   CSN: [id number] Arrival date & time: 04/04/18  1736     History   Chief Complaint Chief Complaint  Patient presents with  . Cough    HPI Lindsay Bernard is a 27 y.o. female.  The history is provided by the patient. No language interpreter was used.  Cough  This is a new problem. The current episode started 2 days ago. The problem occurs constantly. The problem has been gradually worsening. The cough is productive of sputum. There has been no fever. She has tried nothing for the symptoms. The treatment provided no relief. She is a smoker. Her past medical history does not include bronchitis.  Pt reports she has been coughing.  Pt reports sinus congestion    History reviewed. No pertinent past medical history.  There are no active problems to display for this patient.   Past Surgical History:  Procedure Laterality Date  . CESAREAN SECTION    . CESAREAN SECTION    . TUBAL LIGATION       OB History    Gravida  1   Para      Term      Preterm      AB      Living        SAB      TAB      Ectopic      Multiple      Live Births               Home Medications    Prior to Admission medications   Medication Sig Start Date End Date Taking? Authorizing Provider  amoxicillin (AMOXIL) 500 MG capsule Take 1 capsule (500 mg total) by mouth 3 (three) times daily. 04/04/18   Elson Areas, PA-C  benzonatate (TESSALON) 100 MG capsule Take 1 capsule (100 mg total) by mouth every 8 (eight) hours. 04/04/18   Elson Areas, PA-C    Family History No family history on file.  Social History Social History   Tobacco Use  . Smoking status: Current Every Day Smoker    Packs/day: 0.50    Types: Cigarettes  . Smokeless tobacco: Never Used  Substance Use Topics  . Alcohol use: No  . Drug use: No     Allergies   Patient has no known allergies.   Review of Systems Review of Systems  Respiratory:  Positive for cough.   All other systems reviewed and are negative.    Physical Exam Updated Vital Signs BP 127/82 (BP Location: Left Arm)   Pulse 87   Temp 98.4 F (36.9 C) (Oral)   Resp 20   Ht 5\' 4"  (1.626 m)   Wt 113.4 kg   LMP 03/31/2018   SpO2 100%   BMI 42.91 kg/m   Physical Exam  Constitutional: She appears well-developed and well-nourished.  HENT:  Head: Normocephalic.  Eyes: Pupils are equal, round, and reactive to light. Conjunctivae are normal.  Neck: Normal range of motion.  Cardiovascular: Normal rate.  Pulmonary/Chest: Effort normal.  Abdominal: Soft.  Musculoskeletal: Normal range of motion.  Neurological: She is alert.  Skin: Skin is warm.  Psychiatric: She has a normal mood and affect.  Nursing note and vitals reviewed.    ED Treatments / Results  Labs (all labs ordered are listed, but only abnormal results are displayed) Labs Reviewed - No data to display  EKG None  Radiology Dg Chest 2 View  Result Date: 04/04/2018  CLINICAL DATA:  Cough and congestion 2 days. EXAM: CHEST - 2 VIEW COMPARISON:  05/16/2017 FINDINGS: Lungs are adequately inflated without focal consolidation or effusion. Cardiomediastinal silhouette and remainder of the exam is unchanged. IMPRESSION: No active cardiopulmonary disease. Electronically Signed   By: Elberta Fortis M.D.   On: 04/04/2018 18:54    Procedures Procedures (including critical care time)  Medications Ordered in ED Medications - No data to display   Initial Impression / Assessment and Plan / ED Course  I have reviewed the triage vital signs and the nursing notes.  Pertinent labs & imaging results that were available during my care of the patient were reviewed by me and considered in my medical decision making (see chart for details).     MDM  Chest xray reviewed and discussed with pt.  Pt given rx for amoxicillian and tessalon perles  Final Clinical Impressions(s) / ED Diagnoses   Final diagnoses:    Viral URI with cough    ED Discharge Orders         Ordered    benzonatate (TESSALON) 100 MG capsule  Every 8 hours     04/04/18 1949    amoxicillin (AMOXIL) 500 MG capsule  3 times daily     04/04/18 1949           Osie Cheeks 04/05/18 0012    Melene Plan, DO 04/07/18 [phone]

## 2018-08-24 ENCOUNTER — Other Ambulatory Visit: Payer: Self-pay

## 2018-08-24 ENCOUNTER — Emergency Department (HOSPITAL_BASED_OUTPATIENT_CLINIC_OR_DEPARTMENT_OTHER)
Admission: EM | Admit: 2018-08-24 | Discharge: 2018-08-24 | Disposition: A | Discharge status: Home / Self Care | Payer: Self-pay | Attending: Emergency Medicine | Admitting: Emergency Medicine

## 2018-08-24 ENCOUNTER — Encounter (HOSPITAL_BASED_OUTPATIENT_CLINIC_OR_DEPARTMENT_OTHER): Payer: Self-pay | Admitting: *Deleted

## 2018-08-24 DIAGNOSIS — Y929 Unspecified place or not applicable: Secondary | ICD-10-CM | POA: Insufficient documentation

## 2018-08-24 DIAGNOSIS — S60561A Insect bite (nonvenomous) of right hand, initial encounter: Secondary | ICD-10-CM | POA: Insufficient documentation

## 2018-08-24 DIAGNOSIS — F17210 Nicotine dependence, cigarettes, uncomplicated: Secondary | ICD-10-CM | POA: Insufficient documentation

## 2018-08-24 DIAGNOSIS — Y998 Other external cause status: Secondary | ICD-10-CM | POA: Insufficient documentation

## 2018-08-24 DIAGNOSIS — W57XXXA Bitten or stung by nonvenomous insect and other nonvenomous arthropods, initial encounter: Secondary | ICD-10-CM | POA: Insufficient documentation

## 2018-08-24 DIAGNOSIS — Y939 Activity, unspecified: Secondary | ICD-10-CM | POA: Insufficient documentation

## 2018-08-24 MED ORDER — CEPHALEXIN 500 MG PO CAPS: 500.0000 mg | ORAL_CAPSULE | Freq: Four times a day (QID) | ORAL | 0 refills | Status: DC

## 2018-08-24 MED ORDER — DIPHENHYDRAMINE HCL 25 MG PO TABS: 25.0000 mg | ORAL_TABLET | Freq: Four times a day (QID) | ORAL | 0 refills | Status: AC | PRN

## 2018-08-24 NOTE — ED Triage Notes (Signed)
Pt reports insect bite to right hand Thursday and her hand is red and swollen

## 2018-08-24 NOTE — ED Provider Notes (Signed)
[facility] EMERGENCY DEPARTMENT Provider Note   CSN: [id number] Arrival date & time: 08/24/18  1845     History   Chief Complaint Chief Complaint  Patient presents with  . Insect Bite    HPI Lindsay Bernard is a 28 y.o. female who is previously healthy who presents with a 3-day history of right hand swelling and redness after insect bite.  Patient did not see a bug bite her, but felt something when she was has been itching in the area.  She reports she has had spread of redness.  She denies any severe pain, but her hand does feel swollen.  She denies any difficulty breathing or swelling of her lips, tongue, throat.  She denies any fevers.  She has used Benadryl cream with some relief.  HPI  History reviewed. No pertinent past medical history.  There are no active problems to display for this patient.   Past Surgical History:  Procedure Laterality Date  . CESAREAN SECTION    . CESAREAN SECTION    . TUBAL LIGATION       OB History    Gravida  1   Para      Term      Preterm      AB      Living        SAB      TAB      Ectopic      Multiple      Live Births               Home Medications    Prior to Admission medications   Medication Sig Start Date End Date Taking? Authorizing Provider  amoxicillin (AMOXIL) 500 MG capsule Take 1 capsule (500 mg total) by mouth 3 (three) times daily. 04/04/18   Elson Areas, PA-C  benzonatate (TESSALON) 100 MG capsule Take 1 capsule (100 mg total) by mouth every 8 (eight) hours. 04/04/18   Elson Areas, PA-C  cephALEXin (KEFLEX) 500 MG capsule Take 1 capsule (500 mg total) by mouth 4 (four) times daily. 08/24/18   As[place] Craigo, Waylan Boga, PA-C  diphenhydrAMINE (BENADRYL) 25 MG tablet Take 1 tablet (25 mg total) by mouth every 6 (six) hours as needed for itching. 08/24/18   Emi Holes, PA-C    Family History No family history on file.  Social History Social History   Tobacco Use  . Smoking status:  Current Every Day Smoker    Packs/day: 0.50    Types: Cigarettes  . Smokeless tobacco: Never Used  Substance Use Topics  . Alcohol use: Yes    Comment: occasional  . Drug use: No     Allergies   Patient has no known allergies.   Review of Systems Review of Systems  Constitutional: Negative for fever.  HENT: Negative for facial swelling.   Respiratory: Negative for shortness of breath.   Skin: Positive for color change.     Physical Exam Updated Vital Signs BP 111/68 (BP Location: Left Arm)   Pulse 70   Temp 97.9 F (36.6 C) (Oral)   Resp 18   Ht 5\' 4"  (1.626 m)   Wt 113.4 kg   LMP 08/14/2018   SpO2 100%   BMI 42.91 kg/m   Physical Exam Vitals signs and nursing note reviewed.  Constitutional:      General: She is not in acute distress.    Appearance: She is well-developed. She is not diaphoretic.  HENT:  Head: Normocephalic and atraumatic.     Mouth/Throat:     Pharynx: No oropharyngeal exudate.     Comments: No edema noted to the lips, tongue, throat; patent airway Eyes:     General: No scleral icterus.       Right eye: No discharge.        Left eye: No discharge.     Conjunctiva/sclera: Conjunctivae normal.     Pupils: Pupils are equal, round, and reactive to light.  Neck:     Musculoskeletal: Normal range of motion and neck supple.     Thyroid: No thyromegaly.  Cardiovascular:     Rate and Rhythm: Normal rate and regular rhythm.     Heart sounds: Normal heart sounds. No murmur. No friction rub. No gallop.   Pulmonary:     Effort: Pulmonary effort is normal. No respiratory distress.     Breath sounds: Normal breath sounds. No stridor. No wheezing or rales.  Musculoskeletal:       Hands:  Lymphadenopathy:     Cervical: No cervical adenopathy.  Skin:    General: Skin is warm and dry.     Coloration: Skin is not pale.     Findings: No rash.  Neurological:     Mental Status: She is alert.     Coordination: Coordination normal.      ED  Treatments / Results  Labs (all labs ordered are listed, but only abnormal results are displayed) Labs Reviewed - No data to display  EKG None  Radiology No results found.  Procedures Procedures (including critical care time)  Medications Ordered in ED Medications - No data to display   Initial Impression / Assessment and Plan / ED Course  I have reviewed the triage vital signs and the nursing notes.  Pertinent labs & imaging results that were available during my care of the patient were reviewed by me and considered in my medical decision making (see chart for details).     Patient presenting with suspected insect bite to right hand.  There are no signs of abscess, however there is some spreading of redness back around the radial aspect of the wrist.  The area of redness was marked with a skin marker.  Patient advised to continue Benadryl cream and also take Benadryl for itching.  Will cover with Keflex for possible early cellulitis.  Advised recheck in 2 to 3 days.  Return precautions discussed.  Patient understands and agrees with plan.  Patient vitals stable throughout ED course and discharged in satisfactory condition.  Final Clinical Impressions(s) / ED Diagnoses   Final diagnoses:  Insect bite of right hand, initial encounter    ED Discharge Orders         Ordered    cephALEXin (KEFLEX) 500 MG capsule  4 times daily     08/24/18 2045    diphenhydrAMINE (BENADRYL) 25 MG tablet  Every 6 hours PRN     08/24/18 2045           Emi Holes, PA-C 08/24/18 2046    Pricilla Loveless, MD 08/26/18 [phone]

## 2018-08-24 NOTE — Discharge Instructions (Addendum)
Take Benadryl every 6 hours as needed for itching as well as swelling of your hand.  Use Benadryl cream as prescribed to the counter.  Take Keflex until completed.  Please return in 2 to 3 days if your symptoms are not resolving.  Please return sooner if your redness is spreading past the line we drew, you develop fever over 100.4, or any other concerning symptoms.

## 2019-01-03 ENCOUNTER — Telehealth: Payer: Self-pay | Admitting: Family

## 2019-01-03 ENCOUNTER — Other Ambulatory Visit: Payer: Self-pay | Admitting: Internal Medicine

## 2019-01-03 DIAGNOSIS — Z20828 Contact with and (suspected) exposure to other viral communicable diseases: Secondary | ICD-10-CM

## 2019-01-03 DIAGNOSIS — Z20822 Contact with and (suspected) exposure to covid-19: Secondary | ICD-10-CM

## 2019-01-03 MED ORDER — BENZONATATE 100 MG PO CAPS: 100.0000 mg | ORAL_CAPSULE | Freq: Three times a day (TID) | ORAL | 0 refills | Status: AC | PRN

## 2019-01-03 MED ORDER — ALBUTEROL SULFATE HFA 108 (90 BASE) MCG/ACT IN AERS: 2.0000 | INHALATION_SPRAY | RESPIRATORY_TRACT | 2 refills | Status: AC | PRN

## 2019-01-03 NOTE — Progress Notes (Signed)
l °

## 2019-01-03 NOTE — Progress Notes (Signed)
Greater than 5 minutes, yet less than 10 minutes of time have been spent researching, coordinating, and implementing care for this patient today.  Thank you for the details you included in the comment boxes. Those details are very helpful in determining the best course of treatment for you and help us to provide the best care.  E-Visit for Corona Virus Screening   Your current symptoms could be consistent with the coronavirus.  Call your health care provider or local health department to request and arrange formal testing. Many health care providers can now test patients at their office but not all are.  Please quarantine yourself while awaiting your test results.  [facility] [phone], [place] [phone], [facility] [phone] or visit [email or web address]-[email or web address]  and You have been enrolled in MyChart Home Monitoring for COVID-19.  Daily you will receive a questionnaire within the MyChart website. Our COVID-19 response team will be monitoring your responses daily.    COVID-19 is a respiratory illness with symptoms that are similar to the flu. Symptoms are typically mild to moderate, but there have been cases of severe illness and death due to the virus. The following symptoms may appear 2-14 days after exposure: . Fever . Cough . Shortness of breath or difficulty breathing . Chills . Repeated shaking with chills . Muscle pain . Headache . Sore throat . New loss of taste or smell . Fatigue . Congestion or runny nose . Nausea or vomiting . Diarrhea  It is vitally important that if you feel that you have an infection such as this virus or any other virus that you stay home and away from places where you may spread it to others.  You should self-quarantine for 14 days if you have symptoms that could potentially be coronavirus or have been in close contact a with a  person diagnosed with COVID-19 within the last 2 weeks. You should avoid contact with people age 65 and older.   You should wear a mask or cloth face covering over your nose and mouth if you must be around other people or animals, including pets (even at home). Try to stay at least 6 feet away from other people. This will protect the people around you.  You can use medication such as A prescription cough medication called Tessalon Perles 100 mg. You may take 1-2 capsules every 8 hours as needed for cough and A prescription inhaler called Albuterol MDI 90 mcg /actuation 2 puffs every 4 hours as needed for shortness of breath, wheezing, cough  You may also take acetaminophen (Tylenol) as needed for fever.   Reduce your risk of any infection by using the same precautions used for avoiding the common cold or flu:  . Wash your hands often with soap and warm water for at least 20 seconds.  If soap and water are not readily available, use an alcohol-based hand sanitizer with at least 60% alcohol.  . If coughing or sneezing, cover your mouth and nose by coughing or sneezing into the elbow areas of your shirt or coat, into a tissue or into your sleeve (not your hands). . Avoid shaking hands with others and consider head nods or verbal greetings only. . Avoid touching your eyes, nose, or mouth with unwashed hands.  . Avoid close contact with people who are sick. . Avoid places or events with large numbers of people in one location, like concerts or sporting events. . Carefully consider travel plans you have or   are making. . If you are planning any travel outside or inside the [place], visit the CDC's Travelers' Health webpage for the latest health notices. . If you have some symptoms but not all symptoms, continue to monitor at home and seek medical attention if your symptoms worsen. . If you are having a medical emergency, call 911.  HOME CARE . Only take medications as instructed by your medical  team. . Drink plenty of fluids and get plenty of rest. . A steam or ultrasonic humidifier can help if you have congestion.   GET HELP RIGHT AWAY IF YOU HAVE EMERGENCY WARNING SIGNS** FOR COVID-19. If you or someone is showing any of these signs seek emergency medical care immediately. Call 911 or proceed to your closest emergency facility if: . You develop worsening high fever. . Trouble breathing . Bluish lips or face . Persistent pain or pressure in the chest . New confusion . Inability to wake or stay awake . You cough up blood. . Your symptoms become more severe  **This list is not all possible symptoms. Contact your medical provider for any symptoms that are sever or concerning to you.   MAKE SURE YOU   Understand these instructions.  Will watch your condition.  Will get help right away if you are not doing well or get worse.  Your e-visit answers were reviewed by a board certified advanced clinical practitioner to complete your personal care plan.  Depending on the condition, your plan could have included both over the counter or prescription medications.  If there is a problem please reply once you have received a response from your provider.  Your safety is important to us.  If you have drug allergies check your prescription carefully.    You can use MyChart to ask questions about today's visit, request a non-urgent call back, or ask for a work or school excuse for 24 hours related to this e-Visit. If it has been greater than 24 hours you will need to follow up with your provider, or enter a new e-Visit to address those concerns. You will get an e-mail in the next two days asking about your experience.  I hope that your e-visit has been valuable and will speed your recovery. Thank you for using e-visits.    

## 2019-01-09 ENCOUNTER — Encounter (INDEPENDENT_AMBULATORY_CARE_PROVIDER_SITE_OTHER): Payer: Self-pay

## 2019-01-09 LAB — NOVEL CORONAVIRUS, NAA: SARS-CoV-2, NAA: NOT DETECTED

## 2019-05-25 ENCOUNTER — Emergency Department (HOSPITAL_BASED_OUTPATIENT_CLINIC_OR_DEPARTMENT_OTHER)
Admission: EM | Admit: 2019-05-25 | Discharge: 2019-05-25 | Disposition: A | Discharge status: Home / Self Care | Payer: Self-pay | Attending: Emergency Medicine | Admitting: Emergency Medicine

## 2019-05-25 ENCOUNTER — Other Ambulatory Visit: Payer: Self-pay

## 2019-05-25 DIAGNOSIS — K029 Dental caries, unspecified: Secondary | ICD-10-CM

## 2019-05-25 DIAGNOSIS — F17210 Nicotine dependence, cigarettes, uncomplicated: Secondary | ICD-10-CM | POA: Insufficient documentation

## 2019-05-25 MED ORDER — PENICILLIN V POTASSIUM 500 MG PO TABS: 500.0000 mg | ORAL_TABLET | Freq: Four times a day (QID) | ORAL | 0 refills | Status: AC

## 2019-05-25 MED ORDER — BUPIVACAINE-EPINEPHRINE (PF) 0.5% -1:200000 IJ SOLN
1.8000 mL | Freq: Once | INTRAMUSCULAR | Status: AC
  Administered 2019-05-25: 1.8 mL
  Filled 2019-05-25: qty 1.8

## 2019-05-25 MED ORDER — NAPROXEN 500 MG PO TABS: 500.0000 mg | ORAL_TABLET | Freq: Two times a day (BID) | ORAL | 0 refills | Status: AC

## 2019-05-25 NOTE — ED Provider Notes (Signed)
[facility] EMERGENCY DEPARTMENT Provider Note   CSN: [id number] Arrival date & time: 05/25/19  1207     History   Chief Complaint Chief Complaint  Patient presents with  . Dental Pain    HPI Lindsay Bernard is a 28 y.o. female presenting to the ED with complaint of left lower dental pain that began about 3 days ago. She states the pain has been gradually worsening and radiating towards her ear.  She denies drainage in her mouth, fevers, difficulty swallowing or breathing.  She has been treating her symptoms with low-dose Motrin and Tylenol without relief.  She does endorse tobacco use.  She does not have a dentist.     The history is provided by the patient.    No past medical history on file.  There are no active problems to display for this patient.   Past Surgical History:  Procedure Laterality Date  . CESAREAN SECTION    . CESAREAN SECTION    . TUBAL LIGATION       OB History    Gravida  1   Para      Term      Preterm      AB      Living        SAB      TAB      Ectopic      Multiple      Live Births               Home Medications    Prior to Admission medications   Medication Sig Start Date End Date Taking? Authorizing Provider  albuterol (VENTOLIN HFA) 108 (90 Base) MCG/ACT inhaler Inhale 2 puffs into the lungs every 4 (four) hours as needed for wheezing or shortness of breath. 01/03/19   Withrow, Everardo All, FNP  amoxicillin (AMOXIL) 500 MG capsule Take 1 capsule (500 mg total) by mouth 3 (three) times daily. 04/04/18   Elson Areas, PA-C  benzonatate (TESSALON PERLES) 100 MG capsule Take 1-2 capsules (100-200 mg total) by mouth every 8 (eight) hours as needed for cough. 01/03/19   Withrow, Everardo All, FNP  cephALEXin (KEFLEX) 500 MG capsule Take 1 capsule (500 mg total) by mouth 4 (four) times daily. 08/24/18   Law, Waylan Boga, PA-C  diphenhydrAMINE (BENADRYL) 25 MG tablet Take 1 tablet (25 mg total) by mouth every 6 (six) hours as  needed for itching. 08/24/18   Law, Waylan Boga, PA-C  naproxen (NAPROSYN) 500 MG tablet Take 1 tablet (500 mg total) by mouth 2 (two) times daily with a meal. 05/25/19   Grayton Lobo, [place] N, PA-C  penicillin v potassium (VEETID) 500 MG tablet Take 1 tablet (500 mg total) by mouth 4 (four) times daily for 7 days. 05/25/19 06/01/19  Kimari Coudriet, [place] N, PA-C    Family History No family history on file.  Social History Social History   Tobacco Use  . Smoking status: Current Every Day Smoker    Packs/day: 0.50    Types: Cigarettes  . Smokeless tobacco: Never Used  Substance Use Topics  . Alcohol use: Yes    Comment: occasional  . Drug use: No     Allergies   Patient has no known allergies.   Review of Systems Review of Systems  Constitutional: Negative for fever.  HENT: Positive for dental problem.      Physical Exam Updated Vital Signs BP 124/70 (BP Location: Right Arm)   Pulse 64   Temp 98.7  F (37.1 C) (Oral)   Resp 18   Ht 5\' 4"  (1.626 m)   Wt 113.4 kg   LMP 04/30/2019 (Approximate)   SpO2 100%   BMI 42.91 kg/m   Physical Exam Vitals signs and nursing note reviewed.  Constitutional:      Appearance: She is well-developed.  HENT:     Head: Normocephalic and atraumatic.     Mouth/Throat:     Comments: Left lower 2nd molar with decay and TTP. No gingival erythema or fluctuance. No sublingual edema or tenderness.  Uvula midline.  Tolerating secretions.  No trismus. Eyes:     Conjunctiva/sclera: Conjunctivae normal.  Neck:     Musculoskeletal: Normal range of motion and neck supple. No muscular tenderness.  Cardiovascular:     Rate and Rhythm: Normal rate.  Pulmonary:     Effort: Pulmonary effort is normal.  Lymphadenopathy:     Cervical: No cervical adenopathy.  Neurological:     Mental Status: She is alert.  Psychiatric:        Mood and Affect: Mood normal.        Behavior: Behavior normal.      ED Treatments / Results  Labs (all labs ordered  are listed, but only abnormal results are displayed) Labs Reviewed - No data to display  EKG None  Radiology No results found.  Procedures Dental Block  Date/Time: 05/25/2019 2:43 PM Performed by: Amellia Panik, [place] N, PA-C Authorized by: Jackilyn Umphlett, [place] N, PA-C   Consent:    Consent obtained:  Verbal   Consent given by:  Patient   Risks discussed:  Pain, unsuccessful block and intravascular injection   Alternatives discussed:  Alternative treatment and no treatment Indications:    Indications: dental pain   Location:    Block type:  Inferior alveolar   Laterality:  Left Procedure details (see MAR for exact dosages):    Syringe type:  Controlled syringe   Needle gauge:  27 G   Anesthetic injected:  Bupivacaine 0.5% WITH epi   Injection procedure:  Anatomic landmarks palpated Post-procedure details:    Outcome:  Anesthesia achieved   Patient tolerance of procedure:  Tolerated well, no immediate complications   (including critical care time)  Medications Ordered in ED Medications  bupivacaine-epinephrine (MARCAINE W/ EPI) 0.5% -1:200000 injection 1.8 mL (1.8 mLs Infiltration Given 05/25/19 1315)     Initial Impression / Assessment and Plan / ED Course  I have reviewed the triage vital signs and the nursing notes.  Pertinent labs & imaging results that were available during my care of the patient were reviewed by me and considered in my medical decision making (see chart for details).        Patient with dental caries.  No gross abscess.  VSS, afebrile, tolerating secretions. Exam unconcerning for peritonsillar abscess, Ludwig's angina or spread of infection.  Dental block administered for pain relief. Will treat with penicillin and NSAIDs.  Urged patient to follow-up with dentist. Pt safe for discharge.  Discussed results, findings, treatment and follow up. Patient advised of return precautions. Patient verbalized understanding and agreed with plan.  Final  Clinical Impressions(s) / ED Diagnoses   Final diagnoses:  Pain due to dental caries    ED Discharge Orders         Ordered    penicillin v potassium (VEETID) 500 MG tablet  4 times daily     05/25/19 1340    naproxen (NAPROSYN) 500 MG tablet  2 times daily with meals  05/25/19 Morton, [place] N, [place] 05/25/19 1444    Noemi Chapel, MD 05/26/19 [phone]

## 2019-05-25 NOTE — Discharge Instructions (Signed)
Please read instructions below. °Take the antibiotic, Penicillin V, 4 times per day until they are gone. °You can take Naproxen up to 2 times per day with meals, as needed for pain. °Schedule an appointment with a dentist, using the dental resource guide attached. °Return to the ER for difficulty swallowing or breathing, fever, or new or worsening symptoms. ° °

## 2019-05-25 NOTE — ED Triage Notes (Signed)
Pt c/o left side lower dental pain x 3 days. Pt reports broken tooth. Reports left side ear pain. Pt denies fever, Pt reports  200mg  motrin at 0800 and 200mg   1100

## 2019-05-29 ENCOUNTER — Telehealth: Payer: Self-pay | Admitting: Emergency Medicine

## 2019-05-29 DIAGNOSIS — Z20828 Contact with and (suspected) exposure to other viral communicable diseases: Secondary | ICD-10-CM

## 2019-05-29 DIAGNOSIS — Z20822 Contact with and (suspected) exposure to covid-19: Secondary | ICD-10-CM

## 2019-05-29 NOTE — Progress Notes (Signed)
E-Visit for Corona Virus Screening   Your current symptoms could be consistent with the coronavirus.  Many health care providers can now test patients at their office but not all are.  [facility] has multiple testing sites. For information on our COVID testing locations and hours go to [email or web address]  Please quarantine yourself while awaiting your test results.  We are enrolling you in our MyChart Home Montioring for COVID19 . Daily you will receive a questionnaire within the MyChart website. Our COVID 19 response team willl be monitoriing your responses daily. Please continue good preventive care measures, including:  frequent hand-washing, avoid touching your face, cover coughs/sneezes, stay out of crowds and keep a 6 foot distance from others.     You can go to one of the testing sites listed below, while they are opened (see hours). You do not need an order and will stay in your car during the test. You do need to self isolate until your results return and if positive 10 days from when your symptoms started and until you are 3 days fever free.    Testing Locations (Monday - Friday, 8 a.m. - 3:30 p.m.)  [place]: [facility], [address] [place], [place]   [place]: [address], [address], [place], [place] (entrance off [employer])   [place]: (Closed each Monday): Testing site relocated to the short stay covered drive at [facility]. (Use the [employer] entrance to [facility] next to [facility]  COVID-19 is a respiratory illness with symptoms that are similar to the flu. Symptoms are typically mild to moderate, but there have been cases of severe illness and death due to the virus. The following symptoms may appear 2-14 days after exposure: . Fever . Cough . Shortness of breath or difficulty breathing . Chills . Repeated shaking with  chills . Muscle pain . Headache . Sore throat . New loss of taste or smell . Fatigue . Congestion or runny nose . Nausea or vomiting . Diarrhea  If you develop fever/cough/breathlessness, please stay home for 10 days with improving symptoms and until you have had 24 hours of no fever (without taking a fever reducer).  Go to the nearest hospital ED for assessment if fever/cough/breathlessness are severe or illness seems like a threat to life.  It is vitally important that if you feel that you have an infection such as this virus or any other virus that you stay home and away from places where you may spread it to others.  You should avoid contact with people age 44 and older.   You should wear a mask or cloth face covering over your nose and mouth if you must be around other people or animals, including pets (even at home). Try to stay at least 6 feet away from other people. This will protect the people around you.  You can use medication such as Mucinex or dayquil  You may also take ibuprofen as needed for fever.   Reduce your risk of any infection by using the same precautions used for avoiding the common cold or flu:  Marland Kitchen Wash your hands often with soap and warm water for at least 20 seconds.  If soap and water are not readily available, use an alcohol-based hand sanitizer with at least 60% alcohol.  . If coughing or sneezing, cover your mouth and nose by coughing or sneezing into the elbow areas of your shirt or  coat, into a tissue or into your sleeve (not your hands). . Avoid shaking hands with others and consider head nods or verbal greetings only. . Avoid touching your eyes, nose, or mouth with unwashed hands.  . Avoid close contact with people who are sick. . Avoid places or events with large numbers of people in one location, like concerts or sporting events. . Carefully consider travel plans you have or are making. . If you are planning any travel outside or inside the [place], visit the  CDC's Travelers' Health webpage for the latest health notices. . If you have some symptoms but not all symptoms, continue to monitor at home and seek medical attention if your symptoms worsen. . If you are having a medical emergency, call 911.  HOME CARE . Only take medications as instructed by your medical team. . Drink plenty of fluids and get plenty of rest. . A steam or ultrasonic humidifier can help if you have congestion.   GET HELP RIGHT AWAY IF YOU HAVE EMERGENCY WARNING SIGNS** FOR COVID-19. If you or someone is showing any of these signs seek emergency medical care immediately. Call 911 or proceed to your closest emergency facility if: . You develop worsening high fever. . Trouble breathing . Bluish lips or face . Persistent pain or pressure in the chest . New confusion . Inability to wake or stay awake . You cough up blood. . Your symptoms become more severe  **This list is not all possible symptoms. Contact your medical provider for any symptoms that are sever or concerning to you.   MAKE SURE YOU   Understand these instructions.  Will watch your condition.  Will get help right away if you are not doing well or get worse.  Your e-visit answers were reviewed by a board certified advanced clinical practitioner to complete your personal care plan.  Depending on the condition, your plan could have included both over the counter or prescription medications.  If there is a problem please reply once you have received a response from your provider.  Your safety is important to [place].  If you have drug allergies check your prescription carefully.    You can use MyChart to ask questions about today's visit, request a non-urgent call back, or ask for a work or school excuse for 24 hours related to this e-Visit. If it has been greater than 24 hours you will need to follow up with your provider, or enter a new e-Visit to address those concerns. You will get an e-mail in the next two  days asking about your experience.  I hope that your e-visit has been valuable and will speed your recovery. Thank you for using e-visits.   Greater than 5 but less than 10 minutes spent researching, coordinating, and implementing care for this patient today

## 2019-05-30 ENCOUNTER — Other Ambulatory Visit: Payer: Self-pay

## 2019-05-30 DIAGNOSIS — Z20822 Contact with and (suspected) exposure to covid-19: Secondary | ICD-10-CM

## 2019-06-02 LAB — NOVEL CORONAVIRUS, NAA: SARS-CoV-2, NAA: NOT DETECTED

## 2019-11-11 ENCOUNTER — Emergency Department (HOSPITAL_BASED_OUTPATIENT_CLINIC_OR_DEPARTMENT_OTHER): Payer: Self-pay

## 2019-11-11 ENCOUNTER — Other Ambulatory Visit: Payer: Self-pay

## 2019-11-11 ENCOUNTER — Encounter (HOSPITAL_BASED_OUTPATIENT_CLINIC_OR_DEPARTMENT_OTHER): Payer: Self-pay | Admitting: *Deleted

## 2019-11-11 ENCOUNTER — Emergency Department (HOSPITAL_BASED_OUTPATIENT_CLINIC_OR_DEPARTMENT_OTHER)
Admission: EM | Admit: 2019-11-11 | Discharge: 2019-11-11 | Disposition: A | Discharge status: Home / Self Care | Payer: Self-pay | Attending: Emergency Medicine | Admitting: Emergency Medicine

## 2019-11-11 DIAGNOSIS — R059 Cough, unspecified: Secondary | ICD-10-CM

## 2019-11-11 DIAGNOSIS — Z79899 Other long term (current) drug therapy: Secondary | ICD-10-CM | POA: Insufficient documentation

## 2019-11-11 DIAGNOSIS — R05 Cough: Secondary | ICD-10-CM | POA: Insufficient documentation

## 2019-11-11 DIAGNOSIS — F17210 Nicotine dependence, cigarettes, uncomplicated: Secondary | ICD-10-CM | POA: Insufficient documentation

## 2019-11-11 DIAGNOSIS — R0981 Nasal congestion: Secondary | ICD-10-CM | POA: Insufficient documentation

## 2019-11-11 MED ORDER — FLUTICASONE PROPIONATE 50 MCG/ACT NA SUSP: 2.0000 | Freq: Every day | NASAL | 0 refills | Status: AC

## 2019-11-11 MED FILL — FLUTICASONE PROP 50 MCG SPR: 50 | 30 days supply | Qty: 16 | Fill #0

## 2019-11-11 NOTE — ED Provider Notes (Signed)
[facility] EMERGENCY DEPARTMENT Provider Note   CSN: [id number] Arrival date & time: 11/11/19  1110     History Chief Complaint  Patient presents with  . Cough    Lindsay Bernard is a 29 y.o. female.  HPI      Lindsay Bernard is a 29 y.o. female, patient with no pertinent past medical history, presenting to the ED with nasal congestion and rhinorrhea for the last 3 days.  She also developed intermittent cough. She has been using over-the-counter cold medicine and Mucinex. Denies fever/chills, shortness of breath, chest pain, N/V/D, abdominal pain, ear pain, sore throat, or any other complaints.      History reviewed. No pertinent past medical history.  There are no problems to display for this patient.   Past Surgical History:  Procedure Laterality Date  . CESAREAN SECTION    . CESAREAN SECTION    . TUBAL LIGATION       OB History    Gravida  1   Para      Term      Preterm      AB      Living        SAB      TAB      Ectopic      Multiple      Live Births              No family history on file.  Social History   Tobacco Use  . Smoking status: Current Every Day Smoker    Packs/day: 0.50    Types: Cigarettes  . Smokeless tobacco: Never Used  Substance Use Topics  . Alcohol use: Yes    Comment: occasional  . Drug use: No    Home Medications Prior to Admission medications   Medication Sig Start Date End Date Taking? Authorizing Provider  albuterol (VENTOLIN HFA) 108 (90 Base) MCG/ACT inhaler Inhale 2 puffs into the lungs every 4 (four) hours as needed for wheezing or shortness of breath. 01/03/19   Withrow, Everardo All, FNP  benzonatate (TESSALON PERLES) 100 MG capsule Take 1-2 capsules (100-200 mg total) by mouth every 8 (eight) hours as needed for cough. 01/03/19   Withrow, Everardo All, FNP  diphenhydrAMINE (BENADRYL) 25 MG tablet Take 1 tablet (25 mg total) by mouth every 6 (six) hours as needed for itching. 08/24/18   Law, Waylan Boga,  PA-C  fluticasone (FLONASE) 50 MCG/ACT nasal spray Place 2 sprays into both nostrils daily. 11/11/19   Leitha Hyppolite C, PA-C  naproxen (NAPROSYN) 500 MG tablet Take 1 tablet (500 mg total) by mouth 2 (two) times daily with a meal. 05/25/19   Robinson, [place] N, PA-C    Allergies    Patient has no known allergies.  Review of Systems   Review of Systems  Constitutional: Negative for chills and fever.  HENT: Positive for congestion, rhinorrhea and sneezing. Negative for ear discharge, ear pain, sore throat, trouble swallowing and voice change.   Respiratory: Negative for shortness of breath.   Cardiovascular: Negative for chest pain and leg swelling.  Gastrointestinal: Negative for abdominal pain, diarrhea, nausea and vomiting.  Neurological: Negative for dizziness, syncope and weakness.  All other systems reviewed and are negative.   Physical Exam Updated Vital Signs BP 127/75   Pulse 78   Temp 98.4 F (36.9 C) (Oral)   Resp 16   Ht 5\' 4"  (1.626 m)   Wt 113.4 kg   LMP 10/28/2019   SpO2 100%  BMI 42.91 kg/m   Physical Exam Vitals and nursing note reviewed.  Constitutional:      General: She is not in acute distress.    Appearance: She is well-developed. She is not diaphoretic.  HENT:     Head: Normocephalic and atraumatic.     Nose: Mucosal edema, congestion and rhinorrhea present.     Right Sinus: No maxillary sinus tenderness or frontal sinus tenderness.     Left Sinus: No maxillary sinus tenderness or frontal sinus tenderness.     Mouth/Throat:     Mouth: Mucous membranes are moist.     Pharynx: Oropharynx is clear.  Eyes:     Conjunctiva/sclera: Conjunctivae normal.  Cardiovascular:     Rate and Rhythm: Normal rate and regular rhythm.     Pulses: Normal pulses.  Pulmonary:     Effort: Pulmonary effort is normal. No respiratory distress.     Breath sounds: Normal breath sounds.  Abdominal:     Palpations: Abdomen is soft.     Tenderness: There is no abdominal  tenderness. There is no guarding.  Musculoskeletal:     Cervical back: Neck supple.  Lymphadenopathy:     Cervical: No cervical adenopathy.  Skin:    General: Skin is warm and dry.  Neurological:     Mental Status: She is alert.  Psychiatric:        Mood and Affect: Mood and affect normal.        Speech: Speech normal.        Behavior: Behavior normal.     ED Results / Procedures / Treatments   Labs (all labs ordered are listed, but only abnormal results are displayed) Labs Reviewed - No data to display  EKG None  Radiology DG Chest 2 View  Result Date: 11/11/2019 CLINICAL DATA:  Decreased breath sounds.  Productive cough. EXAM: CHEST - 2 VIEW COMPARISON:  04/04/2018. FINDINGS: Mediastinum hilar structures normal. Stable prominent cardiac fat pad. Heart size normal. No pulmonary venous congestion. Lung volumes. Mild right base atelectasis. No pleural effusion or pneumothorax. No acute bony abnormality. IMPRESSION: Low lung volumes.  Mild right base atelectasis. Electronically Signed   By: Maisie Fus  Register   On: 11/11/2019 11:52    Procedures Procedures (including critical care time)  Medications Ordered in ED Medications - No data to display  ED Course  I have reviewed the triage vital signs and the nursing notes.  Pertinent labs & imaging results that were available during my care of the patient were reviewed by me and considered in my medical decision making (see chart for details).    MDM Rules/Calculators/A&P                      Patient presents with nasal congestion and intermittent cough. Patient is nontoxic appearing, afebrile, not tachycardic, not tachypneic, not hypotensive, excellent SPO2 on room air, and is in no apparent distress.   I reviewed and interpreted the patient's radiological studies. Low suspicion for pneumonia or other actionable abnormality at this time.  The patient was given instructions for home care as well as return precautions. Patient  voices understanding of these instructions, accepts the plan, and is comfortable with discharge.   Final Clinical Impression(s) / ED Diagnoses Final diagnoses:  Nasal congestion  Cough    Rx / DC Orders ED Discharge Orders         Ordered    fluticasone (FLONASE) 50 MCG/ACT nasal spray  Daily     11/11/19  1208           Layla Maw 11/11/19 1208    Hayden Rasmussen, MD 11/11/19 [phone]

## 2019-11-11 NOTE — Discharge Instructions (Addendum)
General Viral Syndrome Care Instructions: ° °Your symptoms are likely consistent with a viral illness. Viruses do not require or respond to antibiotics. Treatment is symptomatic care and it is important to note that these symptoms may last for 7-14 days.  ° °Hand washing: Wash your hands throughout the day, but especially before and after touching the face, using the restroom, sneezing, coughing, or touching surfaces that have been coughed or sneezed upon. °Hydration: Symptoms of most illnesses will be intensified and complicated by dehydration. Dehydration can also extend the duration of symptoms. Drink plenty of fluids and get plenty of rest. You should be drinking at least half a liter of water an hour to stay hydrated. Electrolyte drinks (ex. Gatorade, Powerade, Pedialyte) are also encouraged. You should be drinking enough fluids to make your urine light yellow, almost clear. If this is not the case, you are not drinking enough water. °Please note that some of the treatments indicated below will not be effective if you are not adequately hydrated. °Diet: Please concentrate on hydration, however, you may introduce food slowly.  Start with a clear liquid diet, progressed to a full liquid diet, and then bland solids as you are able. °Pain or fever: Ibuprofen, Naproxen, or acetaminophen (generic for Tylenol) for pain or fever.  °Antiinflammatory medications: Take 600 mg of ibuprofen every 6 hours or 440 mg (over the counter dose) to 500 mg (prescription dose) of naproxen every 12 hours for the next 3 days. After this time, these medications may be used as needed for pain. Take these medications with food to avoid upset stomach. Choose only one of these medications, do not take them together. °Acetaminophen (generic for Tylenol): Should you continue to have additional pain while taking the ibuprofen or naproxen, you may add in acetaminophen as needed. Your daily total maximum amount of acetaminophen from all sources  should be limited to 4000mg/day for persons without liver problems, or 2000mg/day for those with liver problems. °Cough: Teas, warm liquids, broths, and honey can help with cough. °Zyrtec or Claritin: May add these medication daily to control underlying symptoms of congestion, sneezing, and other signs of allergies.  These medications are available over-the-counter. Generics: Cetirizine (generic for Zyrtec) and loratadine (generic for Claritin). °Fluticasone: Use fluticasone (generic for Flonase), as directed, for nasal and sinus congestion.  This medication is available over-the-counter. °Congestion: Plain guaifenesin (generic for plain Mucinex) may help relieve congestion. Saline sinus rinses and saline nasal sprays may also help relieve congestion. If you do not have high blood pressure, heart problems, or an allergy to such medications, you may also try phenylephrine or Sudafed. °Sore throat: Warm liquids or Chloraseptic spray may help soothe a sore throat. Gargle twice a day with a salt water solution made from a half teaspoon of salt in a cup of warm water.  °Follow up: Follow up with a primary care provider within the next two weeks should symptoms fail to resolve. °Return: Return to the ED for significantly worsening symptoms, shortness of breath, persistent vomiting, large amounts of blood in stool, or any other major concerns. ° °For prescription assistance, may try using prescription discount sites or apps, such as goodrx.com °

## 2019-11-11 NOTE — ED Notes (Signed)
Pt discharged to home. Discharge instructions have been discussed with patient and/or family members. Pt verbally acknowledges understanding d/c instructions, and endorses comprehension to checkout at registration before leaving.  °

## 2020-07-06 ENCOUNTER — Emergency Department (HOSPITAL_BASED_OUTPATIENT_CLINIC_OR_DEPARTMENT_OTHER)
Admission: EM | Admit: 2020-07-06 | Discharge: 2020-07-06 | Disposition: A | Discharge status: Home / Self Care | Payer: Self-pay | Attending: Emergency Medicine | Admitting: Emergency Medicine

## 2020-07-06 ENCOUNTER — Encounter (HOSPITAL_BASED_OUTPATIENT_CLINIC_OR_DEPARTMENT_OTHER): Payer: Self-pay | Admitting: *Deleted

## 2020-07-06 ENCOUNTER — Other Ambulatory Visit: Payer: Self-pay

## 2020-07-06 DIAGNOSIS — R519 Headache, unspecified: Secondary | ICD-10-CM | POA: Insufficient documentation

## 2020-07-06 DIAGNOSIS — R42 Dizziness and giddiness: Secondary | ICD-10-CM | POA: Insufficient documentation

## 2020-07-06 DIAGNOSIS — Z5321 Procedure and treatment not carried out due to patient leaving prior to being seen by health care provider: Secondary | ICD-10-CM | POA: Insufficient documentation

## 2020-07-06 DIAGNOSIS — R531 Weakness: Secondary | ICD-10-CM | POA: Insufficient documentation

## 2020-07-06 NOTE — ED Triage Notes (Signed)
Headache, weakness and dizziness for 2 days   She stated that she felt dizzy when she gets up.

## 2020-12-09 ENCOUNTER — Encounter (HOSPITAL_BASED_OUTPATIENT_CLINIC_OR_DEPARTMENT_OTHER): Payer: Self-pay | Admitting: Emergency Medicine

## 2020-12-09 ENCOUNTER — Other Ambulatory Visit: Payer: Self-pay

## 2020-12-09 ENCOUNTER — Emergency Department (HOSPITAL_BASED_OUTPATIENT_CLINIC_OR_DEPARTMENT_OTHER)
Admission: EM | Admit: 2020-12-09 | Discharge: 2020-12-09 | Disposition: A | Discharge status: Home / Self Care | Payer: Self-pay | Attending: Emergency Medicine | Admitting: Emergency Medicine

## 2020-12-09 DIAGNOSIS — F17210 Nicotine dependence, cigarettes, uncomplicated: Secondary | ICD-10-CM | POA: Insufficient documentation

## 2020-12-09 DIAGNOSIS — J069 Acute upper respiratory infection, unspecified: Secondary | ICD-10-CM | POA: Insufficient documentation

## 2020-12-09 DIAGNOSIS — R21 Rash and other nonspecific skin eruption: Secondary | ICD-10-CM | POA: Insufficient documentation

## 2020-12-09 MED ORDER — HYDROCORTISONE 1 % EX LOTN: 1.0000 "application " | TOPICAL_LOTION | Freq: Two times a day (BID) | CUTANEOUS | 1 refills | Status: AC

## 2020-12-09 NOTE — ED Triage Notes (Signed)
Rash to abdomen x 2 weeks.  Right ear pain and right sided tonsil pain since yesterday.  No fevers at home.

## 2020-12-09 NOTE — Discharge Instructions (Signed)
Lindsay Bernard, it was a pleasure taking care of you.  For your rash, please apply the prescribed hydrocortisone cream and then a non-scented moisturized twice daily. Try to avoid irritants or tight clothing. If it seems to get worse with the hydrocortisone, stop using it.  For your ear and tonsil pain, there does not appear to be any concerning signs of a bacterial infection at this time. Please use Cepacol lozenges and stay hydrated. If you feel that there is increased swelling on one side or you feel that the throat is becoming narrow, it could be a developing peritonsillar abscess (see provided papers for more information) which would require antibiotics and possibly drainage. Please return to the ED if this happens

## 2020-12-09 NOTE — ED Provider Notes (Signed)
[facility] EMERGENCY DEPARTMENT Provider Note   CSN: [id number] Arrival date & time: 12/09/20  0859     History Chief Complaint  Patient presents with  . Otalgia  . Rash    Lindsay Bernard is a 30 y.o. female with no contributing medical history who presents with complaint of rash on her abdomen going on for 2 weeks and ear pain/sore throat going on for 1 day.  The rash started 2 days ago on the left abdomen and spread across and now involves the entire abdomen diffusely.  It is itchy, not painful.  Yesterday, she noticed that her right ear hurts and then it spread to her jaw and the right side of her throat.  She notes a history of strep throat and ear infections as a child, but no recent issues with this.  She has tried miconazole cream for the rash without any improvement.  She used a new detergent about 2 weeks ago, but has since changed back without any improvement in her rash.  No fever, chills, abdominal pain, nausea, vomiting, or other signs of systemic illness.  Nobody around similar symptoms.   History reviewed. No pertinent past medical history.  There are no problems to display for this patient.   Past Surgical History:  Procedure Laterality Date  . CESAREAN SECTION    . CESAREAN SECTION    . TUBAL LIGATION       OB History    Gravida  2   Para  2   Term      Preterm      AB      Living        SAB      IAB      Ectopic      Multiple      Live Births              No family history on file.  Social History   Tobacco Use  . Smoking status: Current Every Day Smoker    Packs/day: 0.50    Types: Cigarettes  . Smokeless tobacco: Never Used  Vaping Use  . Vaping Use: Never used  Substance Use Topics  . Alcohol use: Yes    Comment: occasional  . Drug use: No    Home Medications Prior to Admission medications   Medication Sig Start Date End Date Taking? Authorizing Provider  hydrocortisone 1 % lotion Apply 1 application topically  2 (two) times daily. 12/09/20  Yes Remo Lipps, MD  albuterol (VENTOLIN HFA) 108 (90 Base) MCG/ACT inhaler Inhale 2 puffs into the lungs every 4 (four) hours as needed for wheezing or shortness of breath. 01/03/19   Withrow, Everardo All, FNP  benzonatate (TESSALON PERLES) 100 MG capsule Take 1-2 capsules (100-200 mg total) by mouth every 8 (eight) hours as needed for cough. 01/03/19   Withrow, Everardo All, FNP  diphenhydrAMINE (BENADRYL) 25 MG tablet Take 1 tablet (25 mg total) by mouth every 6 (six) hours as needed for itching. 08/24/18   Law, Waylan Boga, PA-C  fluticasone (FLONASE) 50 MCG/ACT nasal spray Place 2 sprays into both nostrils daily. 11/11/19   Joy, Shawn C, PA-C  naproxen (NAPROSYN) 500 MG tablet Take 1 tablet (500 mg total) by mouth 2 (two) times daily with a meal. 05/25/19   Robinson, [place] N, PA-C    Allergies    Patient has no known allergies.  Review of Systems   Review of Systems  Constitutional: Negative for chills and fever.  HENT: Positive for ear pain, rhinorrhea, sore throat and tinnitus. Negative for congestion, ear discharge, sinus pain and trouble swallowing.   Respiratory: Positive for cough. Negative for shortness of breath.   Cardiovascular: Negative for chest pain and palpitations.  Gastrointestinal: Negative for abdominal pain, diarrhea, nausea and vomiting.  Genitourinary: Negative for dysuria and frequency.  Skin: Positive for rash.  All other systems reviewed and are negative.   Physical Exam Updated Vital Signs BP 127/88   Pulse 84   Temp 98.5 F (36.9 C)   Resp 16   Ht 5\' 4"  (1.626 m)   Wt 115.2 kg   LMP 11/14/2020   SpO2 99%   BMI 43.60 kg/m   Physical Exam Vitals and nursing note reviewed.  Constitutional:      General: She is not in acute distress.    Appearance: Normal appearance. She is not ill-appearing.  HENT:     Head: Normocephalic and atraumatic.     Right Ear: Tympanic membrane, ear canal and external ear normal.     Left Ear: Tympanic  membrane, ear canal and external ear normal.     Nose: Nose normal.     Mouth/Throat:     Mouth: Mucous membranes are moist.     Comments: Has prominent tonsils, see picture. No erythema or purulence or exudates. No asymmetric swelling.  Enlarged and tender right submandibular lymph node No dental caries Eyes:     Extraocular Movements: Extraocular movements intact.  Cardiovascular:     Rate and Rhythm: Normal rate and regular rhythm.  Pulmonary:     Effort: Pulmonary effort is normal.  Abdominal:     General: Abdomen is flat.  Musculoskeletal:        General: No swelling or deformity. Normal range of motion.     Cervical back: Normal range of motion and neck supple.  Skin:    General: Skin is warm and dry.     Comments: Very small papular rash not involving the hair follicles which involves the abdomen diffusely  Blanchable  Neurological:     General: No focal deficit present.     Mental Status: She is alert and oriented to person, place, and time.  Psychiatric:        Mood and Affect: Mood normal.        Behavior: Behavior normal.       ED Results / Procedures / Treatments   Labs (all labs ordered are listed, but only abnormal results are displayed) Labs Reviewed - No data to display  EKG None  Radiology No results found.  Procedures Procedures   Medications Ordered in ED Medications - No data to display  ED Course  I have reviewed the triage vital signs and the nursing notes.  Pertinent labs & imaging results that were available during my care of the patient were reviewed by me and considered in my medical decision making (see chart for details).    MDM Rules/Calculators/A&P                          Patient has a rash scattered across her abdomen with very small pustules/papules. Blanching. Distribution is inconsistent with shingles or laundry product. Appearance not consistent with cellulitis, bed bugs, scabies. Could be contact or irritation dermatitis  from clothing, though no clear inciting factor in her history. Will treat with hydrocortisone cream and moisturizer, instructed to stop hydrocortisone and return if worsening.  For ear/throat pain, has unremarkable exam  of ear canal. She does have prominent tonsils, but no erythema or exudates, no swelling concerning for peritonsillar abscess. She has a tender lymph node and mild cough. At this time, viral pharyngitis seems most likely. Patient educated on peritonsillar abscess and instructed to return for worsening symptoms.   Final Clinical Impression(s) / ED Diagnoses Final diagnoses:  Rash and nonspecific skin eruption  Viral upper respiratory tract infection    Rx / DC Orders ED Discharge Orders         Ordered    hydrocortisone 1 % lotion  2 times daily        12/09/20 1030           Remo Lipps, MD 12/09/20 1037    Little, Ambrose [place], MD 12/12/20 [phone]

## 2020-12-09 NOTE — ED Notes (Signed)
2 concerns: Lymph nodes on right side of neck/jaw swollen causing pressure up towards her ear  Fine red itchy rash across most of her abdomen

## 2021-10-15 IMAGING — CR DG CHEST 2V
2 series · 2 of 2 positions shown · non-contrast
Comparison: 04/04/2018.

CLINICAL DATA: Decreased breath sounds.  Productive cough.

EXAM:
CHEST - 2 VIEW

[w chest pa]
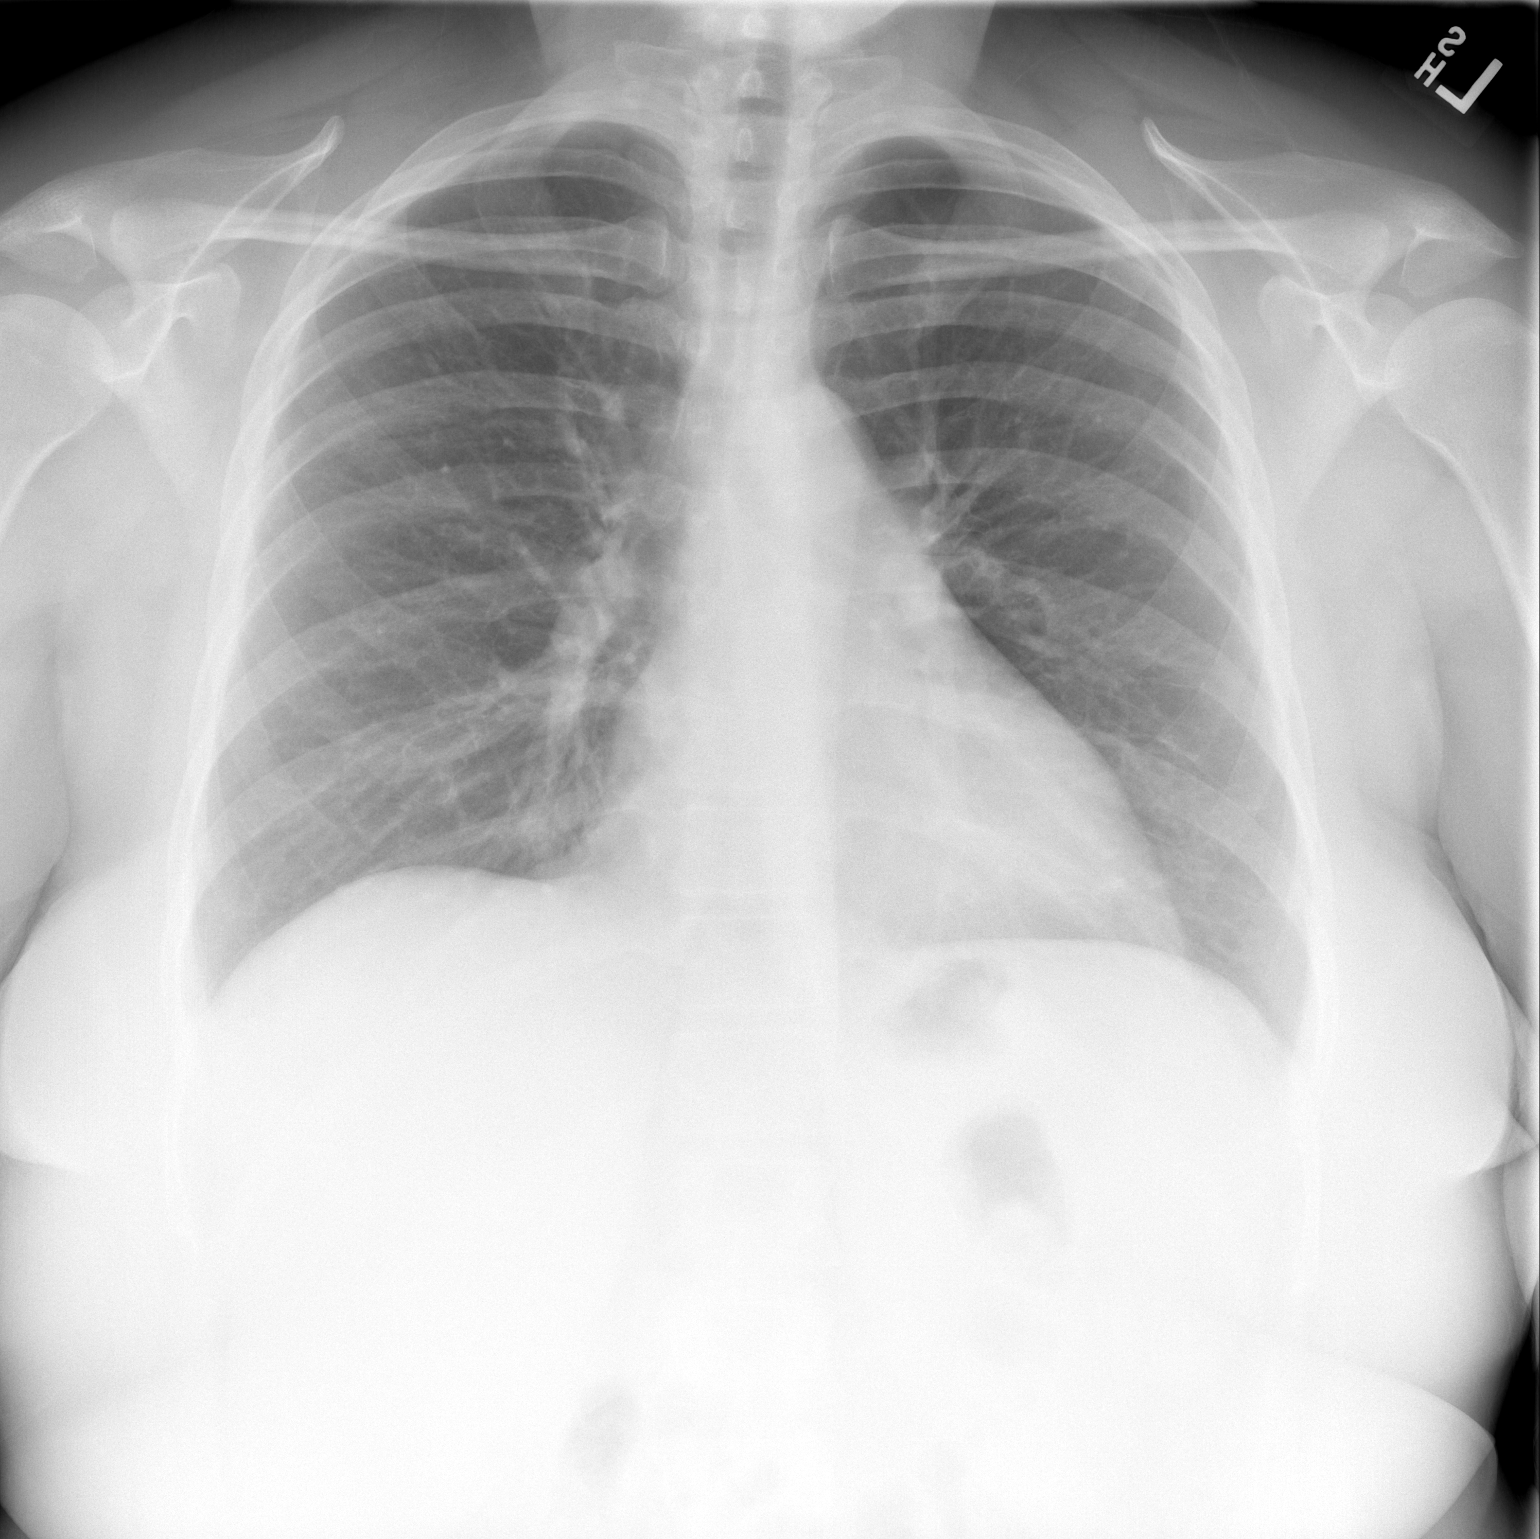

[w chest lat]
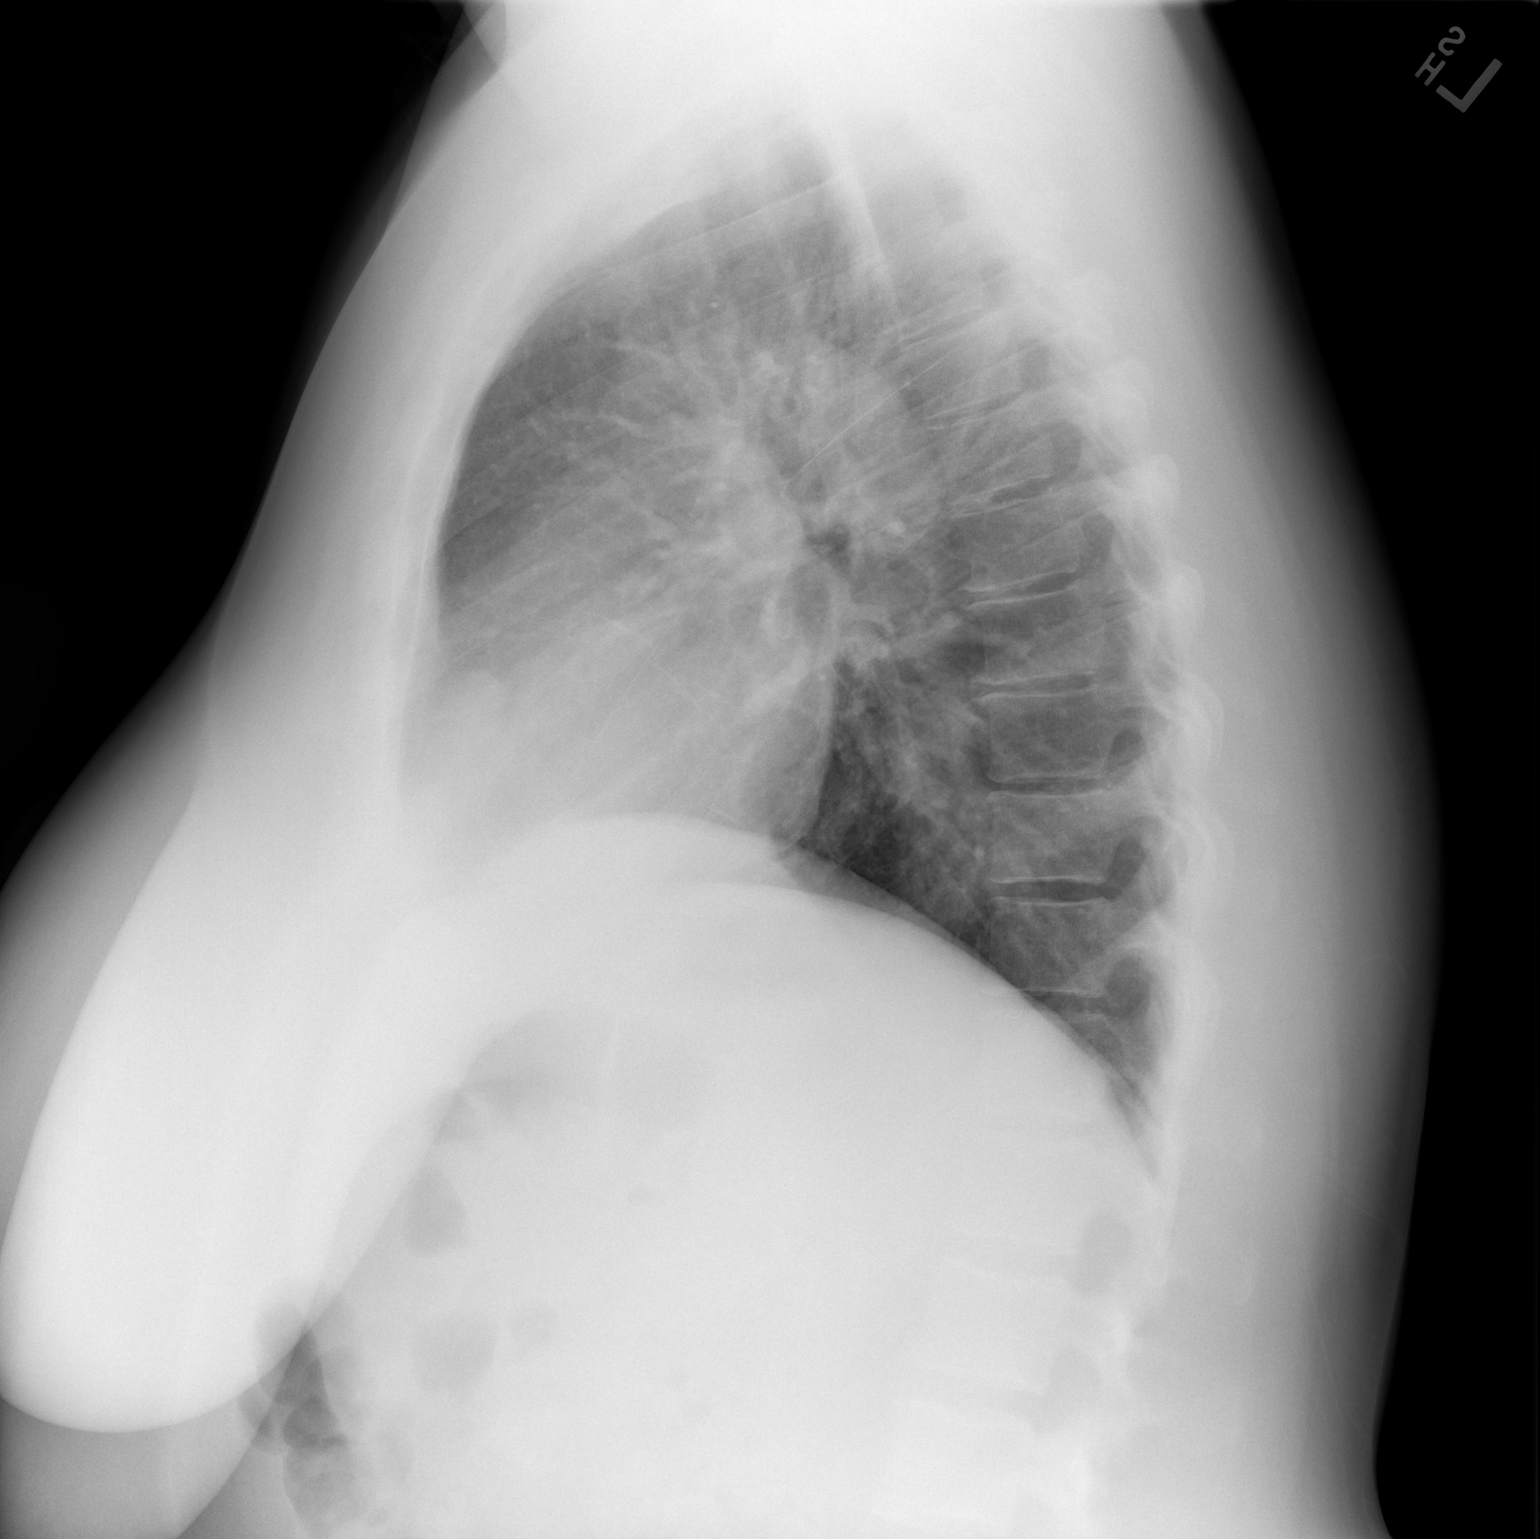

[2 of 2 positions shown; findings below may reference images not displayed]

FINDINGS: Mediastinum hilar structures normal. Stable prominent cardiac fat
pad. Heart size normal. No pulmonary venous congestion. Lung
volumes. Mild right base atelectasis. No pleural effusion or
pneumothorax. No acute bony abnormality.
IMPRESSION: Low lung volumes.  Mild right base atelectasis.
